# Patient Record
Sex: Female | Born: 1980 | Race: Black or African American | Hispanic: No | Marital: Single | Smoking: Never smoker
Health system: Southern US, Community
[De-identification: ages and names within clinical notes are randomized; demographics above are authoritative.]

## PROBLEM LIST (undated history)

## (undated) DIAGNOSIS — N62 Hypertrophy of breast: Secondary | ICD-10-CM

## (undated) DIAGNOSIS — F419 Anxiety disorder, unspecified: Secondary | ICD-10-CM

---
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
## 2003-08-17 ENCOUNTER — Emergency Department (HOSPITAL_COMMUNITY): Admission: EM | Admit: 2003-08-17 | Discharge: 2003-08-17 | Payer: Self-pay | Admitting: Emergency Medicine

## 2005-02-01 ENCOUNTER — Emergency Department (HOSPITAL_COMMUNITY): Admission: EM | Admit: 2005-02-01 | Discharge: 2005-02-01 | Payer: Self-pay | Admitting: *Deleted

## 2005-12-25 ENCOUNTER — Emergency Department (HOSPITAL_COMMUNITY): Admission: EM | Admit: 2005-12-25 | Discharge: 2005-12-25 | Payer: Self-pay | Admitting: Emergency Medicine

## 2006-01-27 ENCOUNTER — Emergency Department (HOSPITAL_COMMUNITY): Admission: EM | Admit: 2006-01-27 | Discharge: 2006-01-27 | Payer: Self-pay | Admitting: Emergency Medicine

## 2006-10-10 ENCOUNTER — Ambulatory Visit (HOSPITAL_COMMUNITY): Admission: RE | Admit: 2006-10-10 | Discharge: 2006-10-10 | Payer: Self-pay | Admitting: Obstetrics & Gynecology

## 2006-10-31 ENCOUNTER — Ambulatory Visit (HOSPITAL_COMMUNITY): Admission: RE | Admit: 2006-10-31 | Discharge: 2006-10-31 | Payer: Self-pay | Admitting: Gynecology

## 2007-04-03 ENCOUNTER — Inpatient Hospital Stay (HOSPITAL_COMMUNITY): Admission: AD | Admit: 2007-04-03 | Discharge: 2007-04-03 | Payer: Self-pay | Admitting: Obstetrics & Gynecology

## 2007-04-03 ENCOUNTER — Ambulatory Visit: Payer: Self-pay | Admitting: Obstetrics & Gynecology

## 2007-04-06 ENCOUNTER — Ambulatory Visit (HOSPITAL_COMMUNITY): Admission: RE | Admit: 2007-04-06 | Discharge: 2007-04-06 | Payer: Self-pay | Admitting: Family Medicine

## 2007-04-09 ENCOUNTER — Ambulatory Visit: Payer: Self-pay | Admitting: Gynecology

## 2007-04-09 ENCOUNTER — Inpatient Hospital Stay (HOSPITAL_COMMUNITY): Admission: RE | Admit: 2007-04-09 | Discharge: 2007-04-11 | Payer: Self-pay | Admitting: Obstetrics & Gynecology

## 2008-03-19 ENCOUNTER — Emergency Department (HOSPITAL_COMMUNITY): Admission: EM | Admit: 2008-03-19 | Discharge: 2008-03-19 | Payer: Self-pay | Admitting: Emergency Medicine

## 2008-03-22 ENCOUNTER — Emergency Department (HOSPITAL_COMMUNITY): Admission: EM | Admit: 2008-03-22 | Discharge: 2008-03-22 | Payer: Self-pay | Admitting: Family Medicine

## 2009-10-01 ENCOUNTER — Ambulatory Visit (HOSPITAL_COMMUNITY): Admission: AD | Admit: 2009-10-01 | Discharge: 2009-10-01 | Payer: Self-pay | Admitting: Obstetrics and Gynecology

## 2009-10-01 ENCOUNTER — Encounter (INDEPENDENT_AMBULATORY_CARE_PROVIDER_SITE_OTHER): Payer: Self-pay | Admitting: Obstetrics & Gynecology

## 2010-06-02 ENCOUNTER — Emergency Department (HOSPITAL_COMMUNITY): Admission: EM | Admit: 2010-06-02 | Discharge: 2010-06-02 | Payer: Self-pay | Admitting: Family Medicine

## 2010-08-30 ENCOUNTER — Inpatient Hospital Stay (HOSPITAL_COMMUNITY): Admission: AD | Admit: 2010-08-30 | Discharge: 2010-08-30 | Payer: Self-pay | Admitting: Obstetrics & Gynecology

## 2010-08-30 ENCOUNTER — Ambulatory Visit: Payer: Self-pay | Admitting: Advanced Practice Midwife

## 2011-01-10 LAB — RPR: RPR Ser Ql: NONREACTIVE

## 2011-01-10 LAB — CBC
HCT: 33 % — ABNORMAL LOW (ref 36.0–46.0)
MCH: 26.4 pg (ref 26.0–34.0)
MCHC: 33.3 g/dL (ref 30.0–36.0)
Platelets: 271 10*3/uL (ref 150–400)

## 2011-01-10 LAB — SURGICAL PCR SCREEN
MRSA, PCR: NEGATIVE
Staphylococcus aureus: NEGATIVE

## 2011-01-17 ENCOUNTER — Other Ambulatory Visit: Payer: Self-pay | Admitting: Obstetrics

## 2011-01-17 ENCOUNTER — Inpatient Hospital Stay (HOSPITAL_COMMUNITY)
Admission: RE | Admit: 2011-01-17 | Discharge: 2011-01-19 | DRG: 766 | Disposition: A | Discharge status: Home / Self Care | Payer: Medicaid Other | Source: Ambulatory Visit | Attending: Obstetrics | Admitting: Obstetrics

## 2011-01-17 DIAGNOSIS — O99892 Other specified diseases and conditions complicating childbirth: Secondary | ICD-10-CM | POA: Diagnosis present

## 2011-01-17 DIAGNOSIS — Z22330 Carrier of Group B streptococcus: Secondary | ICD-10-CM

## 2011-01-17 DIAGNOSIS — Z302 Encounter for sterilization: Secondary | ICD-10-CM

## 2011-01-17 DIAGNOSIS — O34219 Maternal care for unspecified type scar from previous cesarean delivery: Principal | ICD-10-CM | POA: Diagnosis present

## 2011-01-17 LAB — TYPE AND SCREEN
ABO/RH(D): AB POS
Antibody Screen: NEGATIVE

## 2011-01-18 LAB — CBC
Hemoglobin: 9 g/dL — ABNORMAL LOW (ref 12.0–15.0)
MCHC: 33 g/dL (ref 30.0–36.0)
MCV: 80.1 fL (ref 78.0–100.0)
RBC: 3.41 MIL/uL — ABNORMAL LOW (ref 3.87–5.11)
RDW: 13.6 % (ref 11.5–15.5)
WBC: 7.4 10*3/uL (ref 4.0–10.5)

## 2011-01-20 NOTE — Discharge Summary (Signed)
  NAMESHAMYIA, [name]                 ACCOUNT NO.:  [id number]  MEDICAL RECORD NO.:  [id number]           PATIENT TYPE:  I  LOCATION:  9106                          [facility]ACILITY:  [facility]  PHYSICIAN:  Roseanna Rainbow, M.D.DATE OF BIRTH:  [birth year]  DATE OF ADMISSION:  01/17/2011 DATE OF DISCHARGE:  01/19/2011                              DISCHARGE SUMMARY   CHIEF COMPLAINT:  The patient is a 30 year old gravida 6, para 2-0-3-3 who presents for an elective repeat cesarean delivery and bilateral tubal ligation.  HISTORY OF PRESENT ILLNESS:  Please see the above.  ALLERGIES:  No known drug allergies.  SOCIAL HISTORY:  No tobacco, ethanol, or drug use.  PRENATAL LABS:  Blood type AB positive, antibody screen negative, RPR nonreactive, rubella immune, hepatitis B surface antigen negative, HIV nonreactive.  PAST OB HISTORY:  Primary cesarean delivery for planned and two elective cesarean deliveries.  There is also history of 3 voluntary terminations of pregnancy.  PAST MEDICAL HISTORY:  She denies.  PAST SURGICAL HISTORY:  Please see the above.  PAST GYN HISTORY:  Please see the above.  FAMILY HISTORY:  Noncontributory.  REVIEW OF SYSTEMS:  Noncontributory.  PHYSICAL EXAMINATION:  VITAL SIGNS:  Stable, afebrile. GENERAL:  A well-developed, well-nourished black female in no apparent distress. ABDOMEN:  Gravid. PELVIC:  Deferred.  ASSESSMENT:  Multipara at term with history of 2 previous cesarean deliveries, now for an elective repeat cesarean delivery.  HOSPITAL COURSE:  The patient was admitted and underwent a repeat cesarean delivery and bilateral tubal ligation.  Please see the dictated operative summary for findings.  On postoperative day #1, hemoglobin was 9 and a preoperative hemoglobin was 11.  Remainder of her hospital course was uneventful.  She was discharged to home on postoperative day #2.  DISCHARGE DIAGNOSIS:  Multipara at term with a history of 2  previous cesarean deliveries, desires a sterilization procedure.  PROCEDURES:  Repeat cesarean delivery and bilateral tubal ligation.  CONDITION:  Good.  DIET:  Regular.  ACTIVITY:  Pelvic rest, progressive activity.  MEDICATIONS:  Percocet 5/325 one to two tabs every 6 hours as needed. She was counseled to continue prenatal vitamins and over-the-counter iron supplement.  DISPOSITION:  The patient was to follow up with Dr. Gaynell Face.     Roseanna Rainbow, M.D.     [name]  D:  01/19/2011  T:  01/20/2011  Job:  [id number]  cc:   Kathreen Cosier, M.D. Fax: [phone]  Electronically Signed by Antionette Char M.D. on 01/20/2011 10:25:16 AM

## 2011-01-23 NOTE — Op Note (Addendum)
  NAMEBERNADEAN, [name]                 ACCOUNT NO.:  [id number]  MEDICAL RECORD NO.:  [id number]           PATIENT TYPE:  I  LOCATION:  9106                          [facility]:  [facility]  PHYSICIAN:  Kathreen Cosier, M.D.DATE OF BIRTH:  03-14-1981  DATE OF PROCEDURE:  01/17/2011 DATE OF DISCHARGE:                              OPERATIVE REPORT   PREOPERATIVE DIAGNOSES:  Previous cesarean section x2 at term, multiparity, desires repeat low-transverse cesarean section and tubal ligation.  SURGEON:  Kathreen Cosier, MD  FIRST ASSISTANT:  Charles A. Clearance Coots, MD  PROCEDURE:  The patient placed on the operating table under supine position after the spinal was administered.  Abdomen prepped and draped. Bladder emptied with a Foley catheter.  Transverse suprapubic incision was made and carried down to the old scar.  Fascia cleaned and the fascia incised.  Peritoneum opened longitudinally.  Transverse incision was made in the visceral peritoneum above the bladder.  Bladder mobilized inferiorly.  Transverse lower uterine incision was made. There was a window present and the uterine wall was opened manually. She was delivered of a female 6 pounds 6 ounces from the LOA position. The fluid was clear.  Placenta was posterior and removed manually and sent to Labor and Delivery.  Uterine cavity cleaned with dry laps. Uterine incision was closed in one layer with continuous suture of #1 chromic.  Hemostasis was satisfactory.  The right tube grasped in the midportion with a Babcock clamp.  Zero plain suture placed in the mesosalpinx below the portion of tube within the clamp.  This was tied and approximately 1-inch of tube transected.  Hemostasis was satisfactory.  Procedure was done in a similar fashion on the other side.  Lap and sponge counts correct.  Abdomen closed in layers. Peritoneum continuous suture of 0 chromic fascia, continuous suture with 0 Dexon and the skin closed with subcuticular  stitch of 4-0 Monocryl. Blood loss 500 mL.  The patient tolerated the procedure well, was taken to recovery room in good condition.          ______________________________ Kathreen Cosier, M.D.     BAM/MEDQ  D:  01/17/2011  T:  01/18/2011  Job:  [id number]  Electronically Signed by Francoise Ceo M.D. on 01/23/2011 08:16:30 AM

## 2011-02-28 LAB — URINALYSIS, ROUTINE W REFLEX MICROSCOPIC
Glucose, UA: NEGATIVE mg/dL
Urobilinogen, UA: 0.2 mg/dL (ref 0.0–1.0)

## 2011-02-28 LAB — WET PREP, GENITAL: Trich, Wet Prep: NONE SEEN

## 2011-03-03 LAB — POCT URINALYSIS DIP (DEVICE)
Glucose, UA: NEGATIVE mg/dL
Hgb urine dipstick: NEGATIVE
Ketones, ur: 15 mg/dL — AB
Nitrite: NEGATIVE
Protein, ur: NEGATIVE mg/dL
Specific Gravity, Urine: 1.01 (ref 1.005–1.030)

## 2011-03-21 LAB — DIFFERENTIAL
Basophils Absolute: 0 10*3/uL (ref 0.0–0.1)
Basophils Relative: 0 % (ref 0–1)
Eosinophils Absolute: 0 10*3/uL (ref 0.0–0.7)
Eosinophils Relative: 0 % (ref 0–5)
Lymphocytes Relative: 10 % — ABNORMAL LOW (ref 12–46)
Lymphs Abs: 0.5 10*3/uL — ABNORMAL LOW (ref 0.7–4.0)
Monocytes Absolute: 0.3 10*3/uL (ref 0.1–1.0)
Monocytes Relative: 7 % (ref 3–12)
Neutro Abs: 3.8 10*3/uL (ref 1.7–7.7)
Neutrophils Relative %: 82 % — ABNORMAL HIGH (ref 43–77)

## 2011-03-21 LAB — URINALYSIS, ROUTINE W REFLEX MICROSCOPIC
Bilirubin Urine: NEGATIVE
Glucose, UA: NEGATIVE mg/dL
Ketones, ur: NEGATIVE mg/dL
Leukocytes, UA: NEGATIVE
Nitrite: NEGATIVE
Protein, ur: NEGATIVE mg/dL
Specific Gravity, Urine: <1.005 — ABNORMAL LOW (ref 1.005–1.030)
Urobilinogen, UA: 0.2 mg/dL (ref 0.0–1.0)
pH: 5.5 (ref 5.0–8.0)

## 2011-03-21 LAB — CBC
HCT: 28.5 % — ABNORMAL LOW (ref 36.0–46.0)
Hemoglobin: 9.8 g/dL — ABNORMAL LOW (ref 12.0–15.0)
MCHC: 34.4 g/dL (ref 30.0–36.0)
MCV: 86.7 fL (ref 78.0–100.0)
Platelets: 260 10*3/uL (ref 150–400)
RBC: 3.29 MIL/uL — ABNORMAL LOW (ref 3.87–5.11)
RDW: 12.6 % (ref 11.5–15.5)
WBC: 4.6 10*3/uL (ref 4.0–10.5)

## 2011-03-21 LAB — URINE MICROSCOPIC-ADD ON: RBC / HPF: NONE SEEN RBC/hpf (ref <3)

## 2011-03-21 LAB — WET PREP, GENITAL
Trich, Wet Prep: NONE SEEN
Yeast Wet Prep HPF POC: NONE SEEN

## 2011-03-21 LAB — TYPE AND SCREEN
ABO/RH(D): AB POS
Antibody Screen: NEGATIVE

## 2011-05-03 NOTE — Op Note (Signed)
[name], [name]                 ACCOUNT NO.:  [id number]   MEDICAL RECORD NO.:  [id number]          PATIENT TYPE:  INP   LOCATION:  9124                          [facility]  [facility]   PHYSICIAN:  Ginger Carne, MD  DATE OF BIRTH:  27-Nov-1981   DATE OF PROCEDURE:  04/09/2007  DATE OF DISCHARGE:                               OPERATIVE REPORT   PREOPERATIVE DIAGNOSIS:  Intrauterine pregnancy at 41 weeks 5 days  gestation, history of previous cesarean section, failure to dilate,  failed induction.   POSTOPERATIVE DIAGNOSIS:  Intrauterine pregnancy at 41 weeks 5 days  gestation, history of previous cesarean section, failure to dilate,  failed induction.   PROCEDURE:  Repeat low transverse cesarean section.   SURGEON:  Ginger Carne, M.D.   ASSISTANT:  Paticia Stack, M.D.   ANESTHESIA:  Spinal.   SPECIMEN:  Placenta sent to labor and delivery.   ESTIMATED BLOOD LOSS:  600 mL.   COMPLICATIONS:  None.   FINDINGS:  Viable female infant, Apgars 8 and 9 at 1 and 5 minutes  respectively.  Birth weight 7 pounds 12 ounces.   INDICATIONS FOR PROCEDURE:  This is a 30 year old gravida 4, para 1-0-2-  2, at 41 weeks 5 days gestation, who presented for induction of labor  secondary to post dates.  Patient with a history of previous cesarean  section. Cervix was found to be closed, started induction with Pitocin.  The cervix remained closed after several hours. The patient started  feeling uterine contractions and opted for a repeat cesarean section.   DESCRIPTION OF PROCEDURE:  The patient was taken to the operating room  where her spinal anesthesia was found to be adequate.  She was then  prepped and draped in the normal sterile fashion in the dorsal supine  position with a leftward tilt.  A Pfannenstiel skin incision was then  made with a scalpel and carried through to the underlying layer of  fascia.  The fascia was incised in the midline and the incision extended  laterally  with Mayo scissors.  The superior aspect of the fascial  incision was then grasped with Kocher clamps, elevated, and the  underlying rectus muscles dissected off bluntly.  Attention was then  turned to the inferior aspect of the incision which, in a similar  fashion, was grasped, tented up with Kocher clamps, and the rectus  muscles dissected off bluntly. The rectus muscles were then separated in  the midline and the peritoneum identified, tented up, and entered  sharply with the Metzenbaum scissors.  The bilateral rectus muscles were  partially transected to allow more room for delivery of the infant.   The peritoneal incision was then extended superiorly and inferiorly with  good visualization of the bladder. The bladder blade was inserted and  the vesicouterine peritoneum identified, grasped with pickups, and  entered sharply with the Metzenbaum scissors.  This incision was then  extended laterally and a bladder flap created digitally.  The bladder  blade was then reinserted and the lower uterine segment incised in a  transverse fashion with a  scalpel.  The uterine incision was then  extended bluntly.  The bladder blade was removed and the infant's head  delivered atraumatically.  The infant was found to be in the direct OP  presentation.  The nose and mouth were suctioned and the cord clamped  and cut.  The infant was handed off to the awaiting pediatrician. Cord  blood was obtained and sent to the laboratory. The placenta was then  removed manually, the uterus exteriorized and cleared of all clots and  debris.  The uterine incision was repaired with 0 Vicryl in a running  locked fashion.  A second suture was used to obtain excellent  hemostasis. The uterus was returned to the abdomen and the uterine  incision reinspected several times with excellent hemostasis noted.   The gutters were cleared of all clots and debris and the fascia was  reapproximated with 0 Vicryl in a running  fashion.  The skin was closed  with staples.  The patient tolerated the procedure well.  Sponge, lap  and needle counts were correct x2. The patient was taken to the recovery  room in stable condition.     ______________________________  Paticia Stack, MD      Ginger Carne, MD  Electronically Signed    LNJ/MEDQ  D:  04/09/2007  T:  04/09/2007  Job:  [id number]

## 2011-05-03 NOTE — Op Note (Signed)
[facility]  Patient:    [name], [name] Visit Number: [id number] MRN: [id number]          Service Type: OBS Location: 910A [id number] [id number] Attending Physician:  Antionette Char Dictated by:   Bing Neighbors Clearance Coots, M.D. Proc. Date: 08/22/01 Admit Date:  08/21/2001                             Operative Report  PREOPERATIVE DIAGNOSES:       1. Thirty-eight weeks gestation.                               2. Twins, vertex/breech.                               3. Refuses vaginal birth.  POSTOPERATIVE DIAGNOSES:      1. Thirty-eight weeks gestation.                               2. Twins, vertex/breech.                               3. Refuses vaginal birth.  PROCEDURE:                    Primary low transverse cesarean section.  SURGEON:                      Charles A. Clearance Coots, M.D.  ASSISTANT:                    Lysle Pearl, OR tech.  ANESTHESIA:                   Spinal.  ESTIMATED BLOOD LOSS:         800 ml.  INTRAVENOUS FLUIDS:           1300 ml.  URINE OUTPUT:                 600 ml, clear.  COMPLICATIONS:                None.  DRAINS:                       Foley to gravity.  FINDINGS:                     Baby A, viable female, at 70 with Apgars of 6 at one minute and 8 at five minutes and weight of 7 pounds 4 ounces. Baby B, viable female, at 20 with Apgars of 6 at one minute and 8 at five minutes and weight of 6 pounds 4 ounces. Normal uterus, ovaries, and fallopian tubes.  DESCRIPTION OF PROCEDURE:     The patient was brought to the operating room. After satisfactory spinal anesthesia, the abdomen was prepped and draped in the usual sterile fashion. A Pfannenstiel skin incision was made with the scalpel that was deepened down to the fascia with the scalpel. The fascia was nicked in the midline and the fascial incision was extended to the left and to the right with curved Mayo scissors. The superior and inferior fascial edges were taken off  off  of the rectus muscles with both blunt and sharp dissection. The rectus muscles were bluntly and sharply divided in the midline. The peritoneum was entered digitally and was digitally extended to the left and to the right. The bladder blade was positioned and the vesicouterine fold of peritoneum above the reflection of the urinary bladder was grasped with forceps and was incised and undermined with Metzenbaum scissors. The incision was extended to the left and to the right with the Metzenbaum scissors. The bladder flap was bluntly developed and the bladder blade was repositioned in front of the urinary bladder placing it well out of the operative field. The uterus was entered transversely in the lower uterine segment with the scalpel. Clear amniotic fluid was expelled. The uterine incision was then extended to the left and to the right digitally. The vertex was hyperextended and the occiput could not be brought into the incision for delivery and vacuum extraction was therefore applied. A Mityvac cup was applied to the occiput and delivery was accomplished in routine fashion without complications. The delivery was then completed with the aid of fundal pressure from the assistant. The infants mouth and nose were suctioned with the suction bulb and the umbilical cord was doubly clamped and cut and the infant was handed off the nursery staff. The amniotic sac was ruptured on Baby B and was noted to be in the breech presentation. Footling breech delivery was then accomplished without complications. The infants mouth and nose were suctioned with the suction bulb, umbilical cord was doubly clamped and cut, and the infant was handed off to the nursery staff. Cord blood was obtained from the umbilical cords from Baby A and Baby B. The placenta was spontaneously expelled from the uterine cavity intact. The uterus was exteriorized and the endometrial surface was thoroughly debrided with a dry lap  sponge. The edges of the uterine incision were grasped with a ring forceps and the uterus was closed with a continuous interlocking suture of 0 Monocryl from each corner to the center. Hemostasis was excellent. The uterus was placed back in its normal anatomic position. The pelvic cavity was thoroughly irrigated with warm saline solution and all clots were removed. The abdomen was then closed as follows. The fascia was closed with continuous suture of PDS from each corner to the center. The subcutaneous tissue was thoroughly irrigated with warm saline solution and all areas of subcutaneous bleeding were coagulated with the Bovie. The skin was then approximated with surgical stainless steel staples. A sterile bandage was applied to the incision closure. The surgical technician indicated that all needle, sponge, and instrument counts were correct. The patient tolerated the procedure well and was transported to the recovery room in satisfactory condition.  Dictated by:   Bing Neighbors Clearance Coots, M.D. Attending Physician:  Antionette Char DD:  08/22/01 TD:  08/23/01 Job: [id number]

## 2011-05-03 NOTE — Discharge Summary (Signed)
[facility]  Patient:    [name], [name] Visit Number: [id number] MRN: [id number]          Service Type: Attending:  Bing Neighbors. Clearance Coots, M.D. Dictated by:   Mont Dutton, M.D. Adm. Date:  08/21/01 Disc. Date: 08/25/01                             Discharge Summary  ADMISSION DIAGNOSIS:          The patient is a 30 year old gravida 1, para 0, African-American female admitted on August 21, 2001, at 38 weeks and 4 days with dichorionic-diamniotic twins, discordant growth A greater than B, for induction of labor.  Also had unfavorable cervix and episodic uterine contractions.  DISCHARGE DIAGNOSIS:          Status post cesarean section for twin delivery after attempt at induction of labor with Cervidil ripening.  HOSPITAL COURSE:              The patient was admitted initially for induction of labor.  She was at 38 weeks 4 days.  Twin A was vertex, twin B was breech. Also had discordant growth, A greater than B.  The patient was given Cervidil x 1.  Attempted labor for approximately 14 hours.  After 14 hours the patient said that she did not want to go through induction of labor and opted for C-section as the patient adamantly refused labor and delivery and second twin breech.  The risks, benefits, and complications of the procedure were explained.  The patient underwent C-section on August 22, 2001.  The procedure was a low transverse cesarean section.  Surgeon was Dr. Clearance Coots. Spinal anesthesia.  Estimated blood loss 800 mL, 1300 mL IV fluids.  No complications.  The patient had Foley to gravity.  Baby A was a viable female at 1735, Apgars of 6 at one and 8 at five, weight 7 pounds and 4 ounces. Baby B was a viable female at 1736, Apgars of 6 at one and 8 at five, weight was 6 pounds and 4 ounces.  Normal uterus, ovaries, and fallopian tubes.  The patient tolerated the procedure well.  For remainder of procedure, see dictated procedure note,  [phone].  On postoperative day #2 the patient was noted to have anemia, hemoglobin 8.0 on admission, was increased to 8.4 on postoperative day #2.  Started on iron sulfate 325 mg b.i.d.  On hospital day #3 the patient was eating well, voiding well, had positive flatus, and bowel movement.  Staples were removed, Steri-Strips were placed without complication.  The patient was overall stable.  She was ready for discharge to home.  Her abdomen was soft, nontender.  Incision was clean, dry, and intact. Only had scant lochia.  The patient was discharged on August 25, 2001.  She was given Depo-Provera injection prior to her discharge.  DISCHARGE MEDICATIONS:        1. Percocet 1 tablet p.o. q.6h. p.r.n.,                                  prescription given for #30, no refills.                               2. Iron sulfate 325 mg 1 tablet p.o. b.i.d.  3. Prenatal vitamins q.d.                               4. Motrin 600 mg q.6h. p.r.n.                               5. Colace p.r.n.                               6. Dulcolax suppository p.r.n.                               7. ______ p.r.n.  DIET:                         Regular.  ACTIVITY:                     Per booklet.  SEXUAL ACTIVITY:              Per booklet.  FOLLOW-UP:                    The patient has an appointment for the high-risk clinic on October 06, 2001, at 2:20 p.m.  DISPOSITION:                  Discharged to home.  CONDITION ON DISCHARGE:       Stable. Dictated by:   Mont Dutton, M.D. Attending:  Bing Neighbors. Clearance Coots, M.D. DD:  05/21/02 TD:  05/24/02 Job: [id number]

## 2011-05-03 NOTE — Discharge Summary (Signed)
[name], [name]                 ACCOUNT NO.:  [id number]   MEDICAL RECORD NO.:  [id number]          PATIENT TYPE:  INP   LOCATION:  9124                          [facility]ACILITY:  [facility]   PHYSICIAN:  Allie Bossier, MD        DATE OF BIRTH:  June 19, 1981   DATE OF ADMISSION:  04/09/2007  DATE OF DISCHARGE:                               DISCHARGE SUMMARY   DISCHARGE DIAGNOSES:  1. Intrauterine pregnancy at 41-5/7 weeks' gestational age, status      post repeat low transverse cesarean section.  2. Group B Streptococcus positive.   PROCEDURES:  Repeat elective low transverse cesarean section.   LABORATORY DATA:  Pre-delivery hemoglobin 11.1, hematocrit 33.5, and  platelets 373.  Post-delivery hemoglobin 9.1, hematocrit 27.2, and  platelets 331.  RPR nonreactive.  Blood type AB positive, antibody  screen negative.  Rubella immune.  RPR negative.  HIV nonreactive.  GBS  positive.  Hepatitis B negative.   HOSPITAL COURSE:  A 30 year old G44, P1-0-2-2, with prior twin gestation  with a C-section, presented at 41-5/7 weeks' gestational age for  induction of labor and opted for elective repeat C-section.  Please see  the OR note for uncomplicated delivery.  A vigorous female infant was  delivered with Apgars of 8 at one minute and 9 at five minutes with an  estimated blood loss of approximately 600 mL.  Mother and baby were  without complications.  Mom is bottle-feeding exclusively and received  Depo prior to discharge.  The patient asked for early discharge and  given the uncomplicated course, this is appropriate.  She was GBS-  positive and penicillin was given prior to opting for her C-section.  She was advised of pelvic rest x6 weeks and will need to follow up with  the health department in 6 weeks for a postpartum check.  Baby Love  nurse is to remove her staples on postop day 3-5.   DISCHARGE MEDICATIONS:  1. Prenatal vitamin one daily.  2. Ibuprofen 600 mg p.o. q.4-6h. p.r.n. pain.  3.  Percocet one to two tablets p.o. q.4-6h. p.r.n. pain, #30 given.     ______________________________  Lupita Raider, M.D.      Allie Bossier, MD  Electronically Signed    KS/MEDQ  D:  04/11/2007  T:  04/11/2007  Job:  [id number]

## 2011-05-03 NOTE — Consult Note (Signed)
NAMELEXINE, JASPERS                 ACCOUNT NO.:  [id number]   MEDICAL RECORD NO.:  [id number]          PATIENT TYPE:  MAT   LOCATION:  MATC                          FACILITY:  [facility]   PHYSICIAN:  Lazaro Arms, M.D.   DATE OF BIRTH:  [birth year]   DATE OF CONSULTATION:  04/03/2007  DATE OF DISCHARGE:                                 CONSULTATION   MATERNAL ADMISSIONS UNIT OBSERVATION NOTE.   [name] is a 30 year old African-American female gravida 4, para 1,  elective abortion 2, who is followed at the health department for  antepartum care.  Estimated date of delivery is March 28, 2007, by a 15  week and 6 day ultrasound that was discrepant from her last menstrual  period.  She is currently at 40-6/[redacted] weeks gestation.  She comes in with  no complaint, just having missed an appointment, and she has missed  several appointments during the pregnancy.  She called the Health  Department, they told her to be seen because of the missed appointment.  She reports good fetal movement, no bleeding, no gush of fluid and no  regular uterine contractions.  On external fetal monitor she has a  reactive NST, no decels, she is having a little uterine inability but  nothing to even rise to the level of contractions.  Her cervix is long,  thick, closed, firm, posterior, vertex and about -2 station.  She is  quite unfavorable at this point.  She did have a previous C-section for  twin gestation back in 2002 with the second twin being breech, and she  has decided to do vaginal birth after cesarean.  At the present time she  is discharged home.  She is instructed to contact the Health Department  on Monday for appointment and decision and coming back in for induction  of labor later in the week.  She understands that that will be again  another decision point in whether to continue with a vaginal birth after  cesarean, at the time if induction is needed.  The rest of her  antepartum course has been  unremarkable and she reports no other  problems.  She is discharged to home.      Lazaro Arms, M.D.  Electronically Signed     LHE/MEDQ  D:  04/03/2007  T:  04/04/2007  Job:  [id number]

## 2014-10-24 ENCOUNTER — Emergency Department (HOSPITAL_COMMUNITY)
Admission: EM | Admit: 2014-10-24 | Discharge: 2014-10-24 | Disposition: A | Discharge status: Home / Self Care | Payer: Medicaid Other | Attending: Emergency Medicine | Admitting: Emergency Medicine

## 2014-10-24 ENCOUNTER — Emergency Department (HOSPITAL_COMMUNITY): Payer: Medicaid Other

## 2014-10-24 ENCOUNTER — Encounter (HOSPITAL_COMMUNITY): Payer: Self-pay | Admitting: Emergency Medicine

## 2014-10-24 DIAGNOSIS — S99921A Unspecified injury of right foot, initial encounter: Secondary | ICD-10-CM | POA: Insufficient documentation

## 2014-10-24 DIAGNOSIS — S91301A Unspecified open wound, right foot, initial encounter: Secondary | ICD-10-CM | POA: Insufficient documentation

## 2014-10-24 DIAGNOSIS — Y9389 Activity, other specified: Secondary | ICD-10-CM | POA: Insufficient documentation

## 2014-10-24 DIAGNOSIS — W208XXA Other cause of strike by thrown, projected or falling object, initial encounter: Secondary | ICD-10-CM | POA: Insufficient documentation

## 2014-10-24 DIAGNOSIS — Y998 Other external cause status: Secondary | ICD-10-CM | POA: Insufficient documentation

## 2014-10-24 DIAGNOSIS — T1490XA Injury, unspecified, initial encounter: Secondary | ICD-10-CM

## 2014-10-24 DIAGNOSIS — Y9289 Other specified places as the place of occurrence of the external cause: Secondary | ICD-10-CM | POA: Insufficient documentation

## 2014-10-24 MED ORDER — NAPROXEN 500 MG PO TABS: 500.0000 mg | ORAL_TABLET | Freq: Two times a day (BID) | ORAL | Status: DC

## 2014-10-24 NOTE — ED Notes (Signed)
refused wheelchair 

## 2014-10-24 NOTE — Discharge Instructions (Signed)
Please follow directions provided. You may use the primary care provider referral given to establish care for regular healthcare needs. You may use the orthopedic referral given, to  follow-up if your pain continues.  You may use Tylenol 650 mg by mouth every 4 hours for ibuprofen 400 mg by mouth every 6 hours for pain and inflammation. You may use ice 20 minutes on 20 minutes off as needed for pain and swelling. Don't hesitate to return for any new, worsening, or concerning symptoms.  SEEK IMMEDIATE MEDICAL CARE IF:  You have increased redness, swelling, or pain in your foot.  Your swelling or pain is not relieved with medicines.  You have loss of feeling in your foot or are unable to move your toes.  Your foot turns cold or blue.  You have pain when you move your toes.  Your foot becomes warm to the touch.  Your contusion does not improve in 2 days.    Emergency Department Resource Guide 1) Find a Doctor and Pay Out of Pocket Although you won't have to find out who is covered by your insurance plan, it is a good idea to ask around and get recommendations. You will then need to call the office and see if the doctor you have chosen will accept you as a new patient and what types of options they offer for patients who are self-pay. Some doctors offer discounts or will set up payment plans for their patients who do not have insurance, but you will need to ask so you aren't surprised when you get to your appointment.  2) Contact Your Local Health Department Not all health departments have doctors that can see patients for sick visits, but many do, so it is worth a call to see if yours does. If you don't know where your local health department is, you can check in your phone book. The CDC also has a tool to help you locate your state's health department, and many state websites also have listings of all of their local health departments.  3) Find a Walk-in Clinic If your illness is not likely to be  very severe or complicated, you may want to try a walk in clinic. These are popping up all over the country in pharmacies, drugstores, and shopping centers. They're usually staffed by nurse practitioners or physician assistants that have been trained to treat common illnesses and complaints. They're usually fairly quick and inexpensive. However, if you have serious medical issues or chronic medical problems, these are probably not your best option.  No Primary Care Doctor: - Call Health Connect at  [phone] - they can help you locate a primary care doctor that  accepts your insurance, provides certain services, etc. - Physician Referral Service- [phone]  Chronic Pain Problems: Organization         Address  Phone   Notes  [facility]  [phone] Patients need to be referred by their primary care doctor.   Medication Assistance: Organization         Address  Phone   Notes  [facility] Medication [facility] [address] [place], [place] [address] [phone] --Must be a resident of [facility] -- Must have NO insurance coverage whatsoever (no Medicaid/ Medicare, etc.) -- The pt. MUST have a primary care doctor that directs their care regularly and follows them in the community   [facility]  [phone]   [address]  [phone]  Agencies that provide inexpensive medical care: Organization         Address  Phone   Notes  [facility]  [phone]) [phone]   [facility]    [phone]   [facility] [address] [place], [place] [address] [phone]) [phone]   [facility] [place] [address], [place] [phone]   Planned Parenthood    [phone](336) [phone]   [facility]    [phone]   [facility]  [address], [place] Phone:  [phone], Fax:  [phone]) [phone] Hours of Operation:  9 am - 6 pm, M-F.  Also  accepts Medicaid/Medicare and self-pay.  [facility]  [address], [place] Phone: [phone]) [phone], Fax: [phone]) [phone]. Hours of Operation:  8:30 am - 5:30 pm, M-F.  Also accepts Medicaid and self-pay.  [facility] [place] [address], [place] Phone: [phone]) [phone]   [facility] [address], [place], [place] [phone], Ext. [phone] Mondays & Thursdays: 7-9 AM.  First 15 patients are seen on a first come, first serve basis.    Medicaid-accepting [facility] Providers:  Organization         Address  Phone   Notes  [facility] [address], [place] [phone] Also accepts self-pay patients.  [facility] [address], [place]  [phone]) [phone]   [facility] [address], [place] [phone]) [phone]   [facility] [address] [place] [address], [place] [phone]) [phone]   Renaye RakersVeita Bland [address], [place]   [phone] Only accepts [place] Access [place] patients after they have their name applied to their card.   Self-Pay (no insurance) in [place]:  Organization         Address  Phone   Notes  Sickle Cell Patients, [facility] [address], [place] [phone]) [phone]   [facility] [address], [place] [phone]) [phone]   [facility] [place]  [address], [place] [phone]   [facility]/Dr. Osei-Bonsu  [address] [place] [address], [place] or [phone], [place] [phone] Phone number for both [place] and [place] locations is the same.  [facility] [address] [place] [address], [place] [phone]) [phone]   [facility] [place] [address] [place] [address], [place] or [address] [phone]) [phone]   [facility] [address],  [place] [phone]) [phone], phone; [phone]) [phone], fax Sees patients 1st and 3rd Saturday of every month.  Must not qualify for public or private insurance (i.e. Medicaid, Medicare, [place] Health Choice, Veterans' Benefits)  Household income should be no more than 200% of the poverty level The clinic cannot treat you if you are pregnant or think you are pregnant  Sexually transmitted diseases are not treated at the clinic.    Dental Care: Organization         Address  Phone  Notes  [facility] [address], [place] [phone]) [phone] Accepts children up to age 31 who are enrolled in [place] or [place] Health Choice; pregnant women with a Medicaid card; and children who have applied for Medicaid or  [place] Health Choice, but were declined, whose parents can pay a reduced fee at time of service.  [facility]  [address], [place] [phone] Accepts children up to age 62 who are enrolled in [place] or [place] Health Choice; pregnant women with a Medicaid card; and children who have applied for Medicaid or [place] Health Choice, but were declined, whose parents can pay a reduced fee at time of service.  [facility]  [address] [place], [place] [phone] Patients are seen by appointment only. Walk-ins are not accepted. [facility] will see patients 62 years of age and older. Monday - Tuesday (8am-5pm) Most Wednesdays (8:30-5pm) $30 per visit, cash only  [facility] Adult Dental Access PRO[facility]  [address], [facility] [phone] Patients are seen by appointment only. Walk-ins are not accepted. [facility] will see patients 62 years of age and older. One Wednesday Evening (Monthly: Volunteer Based).  $30 per visit, cash only  [employer]  [phone] for adults; Children under age 57, call Graduate Pediatric Dentistry at [phone].  Children aged 52-14, please call [phone] to request a pediatric application.  Dental services are provided in all areas of dental care including fillings, crowns and bridges, complete and partial dentures, implants, gum treatment, root canals, and extractions. Preventive care is also provided. Treatment is provided to both adults and children. Patients are selected via a lottery and there is often a waiting list.   [facility] [address], [place]  [phone] [email or web address]   [facility] [address] [place], [place] [phone], Ext. [phone] Second and Fourth Thursday of each month, opens at 6:30 AM; Clinic ends at 9 AM.  Patients are seen on a first-come first-served basis, and a limited number are seen during each clinic.   [facility]  [address] [place], [place] [phone]   Eligibility Requirements You must have lived in [place], [place], or [place] counties for at least the last three months.   You cannot be eligible for state or federal sponsored [place], including [employer], [place], or [employer].   You generally cannot be eligible for healthcare insurance through your employer.    How to apply: Eligibility screenings are held every Tuesday and Wednesday afternoon from 1:00 pm until 4:00 pm. You do not need an appointment for the interview!  [facility] [address], [place], [place] [phone]   [facility] Health Department  [phone]   [facility] Health Department  [phone]   [facility] Health Department  [phone]    Behavioral Health Resources in the Community: Intensive Outpatient Programs Organization         Address  Phone  Notes  [facility] [address] [place], [place] [phone]   [facility] Outpatient [address], [place], [place] [phone]   [facility] [address] [place], [place]  [phone]   [facility] Mental [facility] [address],  [place], [place] [phone] or [phone]   Substance Abuse Resources Organization         Address  Phone  Notes  [facility] Drug Services  [phone]   [facility]  [phone]   [facility]  [phone]   Floydene Flock  [phone]   Residential & Outpatient Substance Abuse Program  [phone]  Psychological Services Organization         Address  Phone  Notes  [facility]  [phone]- [phone]   [facility]  [phone]- [phone]   [facility] [phone] [address], [place] [phone] or [phone]    Mobile Crisis Teams Organization         Address  Phone  Notes  [facility], Mobile Crisis Care Unit  [phone]   [facility]  [address] [place], [place] [phone]   Doristine LocksSharon DeEsch [address] [place] [phone]    Self-Help/Support Groups Organization         Address  Phone             Notes  [facility] [place] - variety of support groups  [phone]- [phone] Call for more information  Narcotics Anonymous (NA), [facility] [address], [place]  2 meetings at this location   Statisticianesidential Treatment Programs Organization         Address  Phone  Notes  [facility] [address],    [place]  [phone]   [facility]  [address], [place] [phone], [place], [place] [phone]   [facility] [address], [place] [phone] Admissions: 8am-3pm M-F  [facility] [address]    [place], [place] [phone]   [facility] [address], [place], [place] [phone]   [facility] [address]  [place], [place] [phone]   [facility] - Intensive Outpatient [address], [place], [place] [phone]     [facility] [address]  [place], [place] [phone] or [phone]   [facility] ([facility]) [address] [place], [place] [phone] Accepts Medicaid  [facility] [address]  [place] [phone] Substance Abuse/Addiction Treatment   [facility] Behavioral Health Resources Organization         Address  Phone  Notes  [facility]  [phone]) [phone]   Angie FavaJulie Brannon, PhD [address], [address] [place], [place]   [phone]) [phone] or [phone](336) [phone]   [facility]   [address] [place], [place] [phone]) [phone]   [facility] [address], [place], [place] [phone]) [phone] Insurance/Medicaid/sponsorship through [facility]  [facility] Families [address]                                    [place], [place] [phone]) [phone] Therapy/tele-psych/case  [facility] [address].   [place], [place] [phone]) [phone]    Dr. Lolly MustacheArfeen  [phone]) [phone]   [facility] [place]  [employer] [facility] 1) [address], [place] 2) [address], [place] 3)  [address], [place] [phone]) [phone]) [phone]  [phone](336) [phone]   [facility] Child Abuse Hotline [phone]) [phone] or [phone](336) [phone] (After Hours)

## 2014-10-24 NOTE — ED Provider Notes (Signed)
CSN: [id number]     Arrival date & time 10/24/14  1515 History  This chart was scribed for non-physician practitioner, Harle BattiestElizabeth Ruhama Lehew, NP working with Gwyneth SproutWhitney Plunkett, MD by Gwenyth Oberatherine Macek, ED scribe. This patient was seen in room TR07C/TR07C and the patient's care was started at 4:08 PM  Chief Complaint  Patient presents with  . Foot Pain   The history is provided by the patient. No language interpreter was used.    HPI Comments: [name] is a 33 y.o. female who presents to the Emergency Department complaining of 6/10, constant, right foot pain and a wound to the dorsal aspect of her right foot that occurred 2 days ago when she dropped a TV. Pt states swelling and tingling in her right great toe as associated symptoms. She has tried peroxide with no relief.  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 Her last tetanus shot was last year.    History reviewed. No pertinent past medical history. Past Surgical History  Procedure Laterality Date  . Cesarean section     No family history on file. History  Substance Use Topics  . Smoking status: Never Smoker   . Smokeless tobacco: Not on file  . Alcohol Use: Yes   OB History    No data available     Review of Systems  Musculoskeletal: Positive for arthralgias. Negative for gait problem.  Skin: Positive for wound. Negative for color change.    Allergies  Review of patient's allergies indicates no known allergies.  Home Medications   Prior to Admission medications   Not on File   BP 127/72 mmHg  Pulse 74  Temp(Src) 97.7 F (36.5 C) (Oral)  Resp 18  Ht 5\' 6"  (1.676 m)  SpO2 100%  LMP 10/10/2014 (Approximate) Physical Exam  Constitutional: She is oriented to person, place, and time. She appears well-developed and well-nourished. No distress.  HENT:  Head: Normocephalic and atraumatic.  Mouth/Throat: Oropharynx is clear and moist. No oropharyngeal exudate.  Eyes: Pupils  are equal, round, and reactive to light.  Neck: Neck supple.  Cardiovascular: Normal rate.   Pulmonary/Chest: Effort normal.  Musculoskeletal: Normal range of motion. She exhibits tenderness. She exhibits no edema.  Skin tear to dorsal aspect of metatarsals. Mild soft tissue swelling from medial portion of foot to ankle. Tender to palpation to plantar aspect of foot. 5/5 strength in all toes with flexion and extension. Reports tingling sensation in big toe. Good pulse and motor sensation in all other toes. Skin is warm to touch.  Neurological: She is alert and oriented to person, place, and time. No cranial nerve deficit.  Skin: Skin is warm and dry. No rash noted.  Psychiatric: She has a normal mood and affect. Her behavior is normal.  Nursing note and vitals reviewed.   ED Course  Procedures (including critical care time) DIAGNOSTIC STUDIES: Oxygen Saturation is 100% on RA, normal by my interpretation.    COORDINATION OF CARE: 4:14 PM Discussed treatment plan with pt at bedside and pt agreed to plan.  Labs Review Labs Reviewed - No data to display  Imaging Review Dg Foot Complete Right  10/24/2014   CLINICAL DATA:  He dropped on foot 10/22/2014.  Pain.  EXAM: RIGHT FOOT COMPLETE - 3+ VIEW  COMPARISON:  03/22/2008.  FINDINGS: There is no evidence of fracture or dislocation. There is no evidence of arthropathy or other focal bone abnormality. Soft tissues are unremarkable.  IMPRESSION: No acute abnormality.   Electronically Signed   By: Maisie Fushomas  Register   On: 10/24/2014 17:15     EKG Interpretation None      MDM   Final diagnoses:  Trauma  Foot injury, right, initial encounter   33 yo with injury to right foot after dropping tv from low height on foot. Patient X-Ray negative for obvious fracture or dislocation. Pt deferred any pain meds in the ED. Discharge instructions include ortho referral to follow up if symptoms persist. Patient given brace while in ED, conservative  therapy recommended and discussed. Patient will be dc home & is agreeable with above plan. Return precautions provided.  I personally performed the services described in this documentation, which was scribed in my presence. The recorded information has been reviewed and is accurate.  Filed Vitals:   10/24/14 1530  BP: 127/72  Pulse: 74  Temp: 97.7 F (36.5 C)  TempSrc: Oral  Resp: 18  Height: 5\' 6"  (1.676 m)  SpO2: 100%   Meds given in ED:  Medications - No data to display  New Prescriptions   NAPROXEN (NAPROSYN) 500 MG TABLET    Take 1 tablet (500 mg total) by mouth 2 (two) times daily with a meal.       Harle BattiestElizabeth Austina Constantin, NP 10/24/14 [id number]  Gwyneth SproutWhitney Plunkett, MD 10/24/14 2056

## 2014-10-24 NOTE — ED Notes (Signed)
Pt reports she dropped TV on her R foot Saturday. C/o continued pain to top of foot. Swelling and slight bleeding noted to foot. Pt ambulatory.

## 2015-01-18 IMAGING — CR DG FOOT COMPLETE 3+V*R*
3 series · 3 of 3 positions shown · non-contrast
Comparison: 03/22/2008.

CLINICAL DATA: He dropped on foot 10/22/2014.  Pain.

EXAM:
RIGHT FOOT COMPLETE - 3+ VIEW

[t foot ap right]
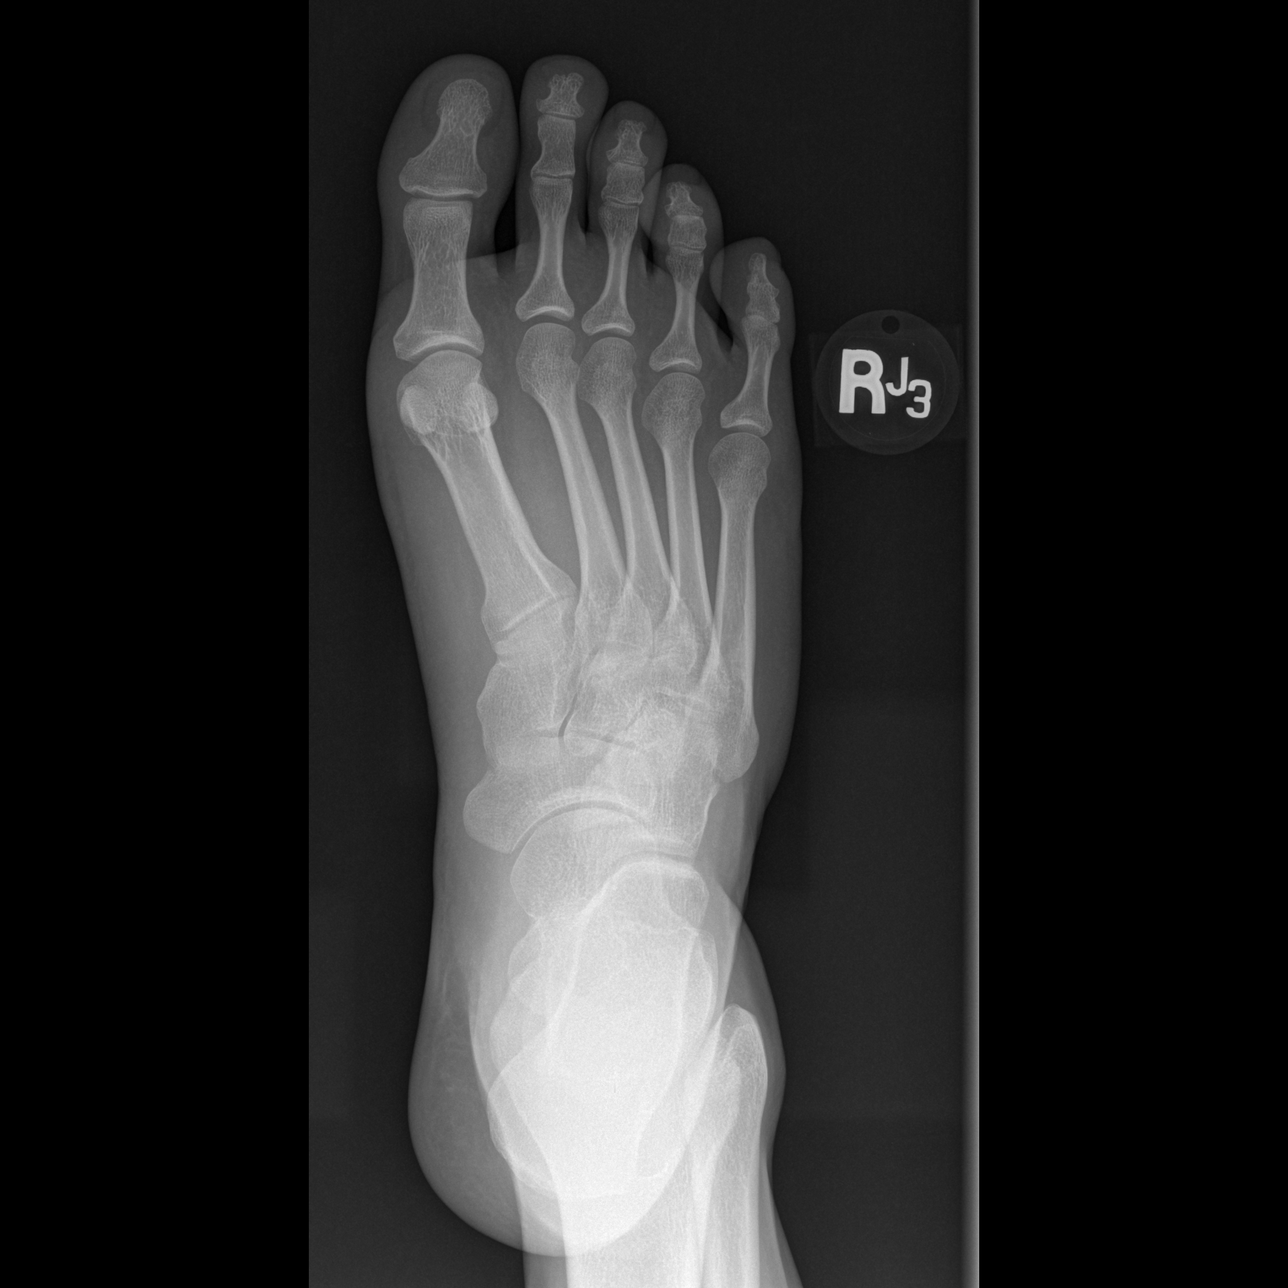

[t foot oblique right]
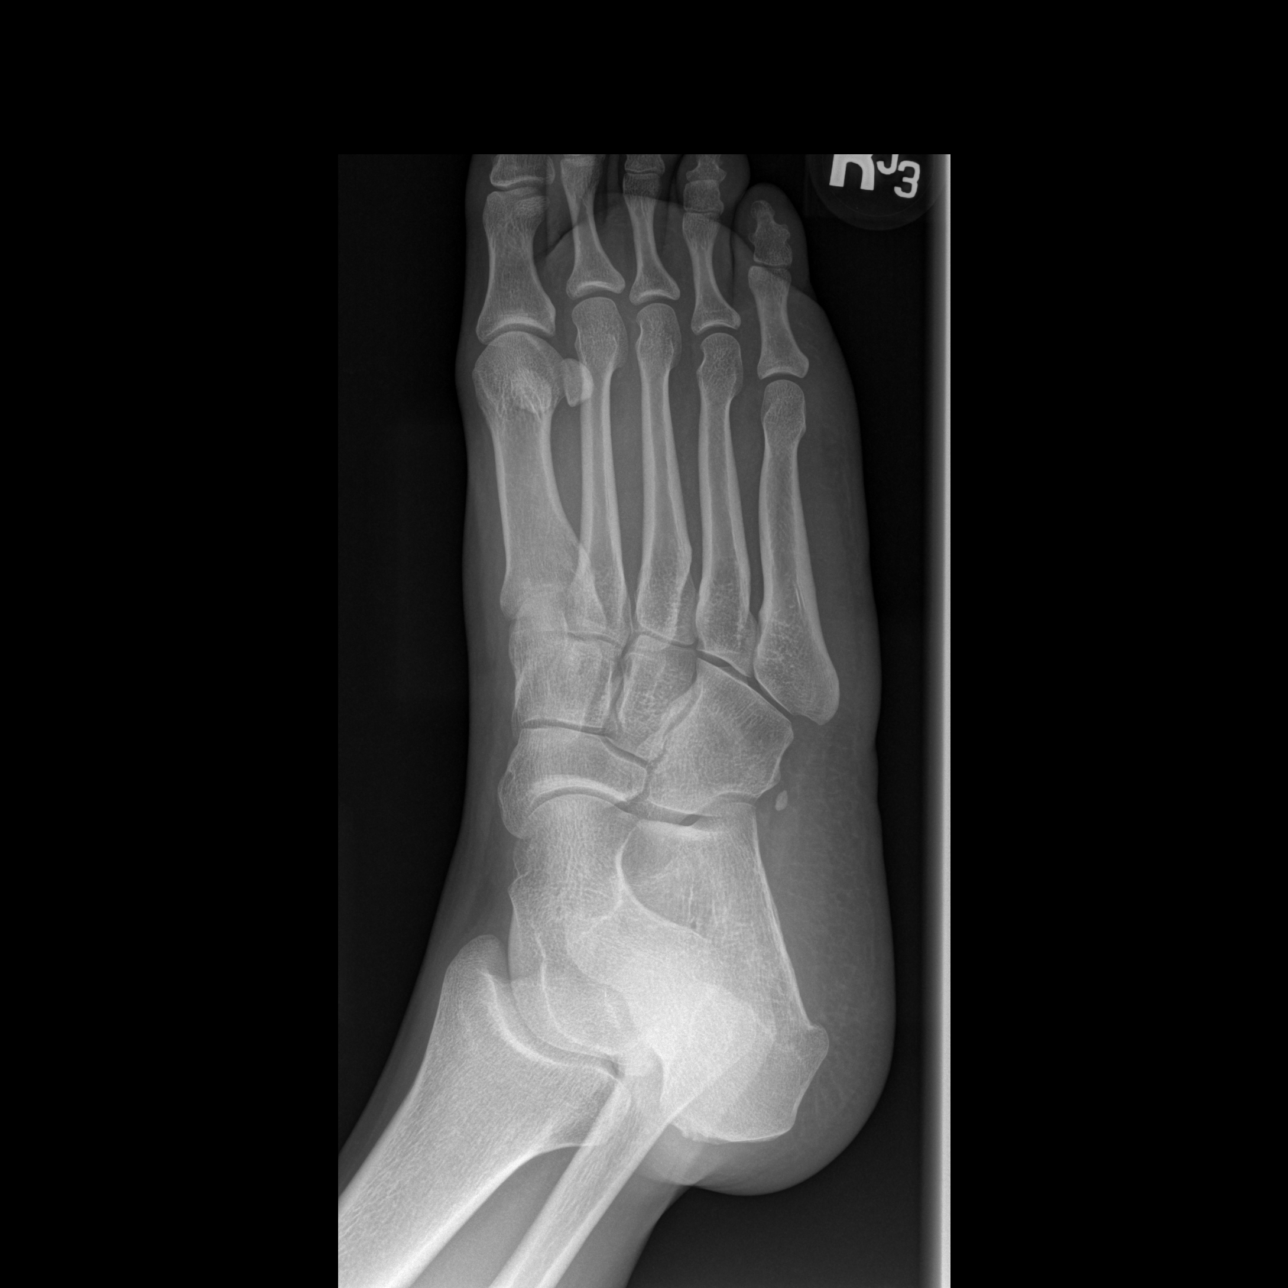

[t foot lat right]
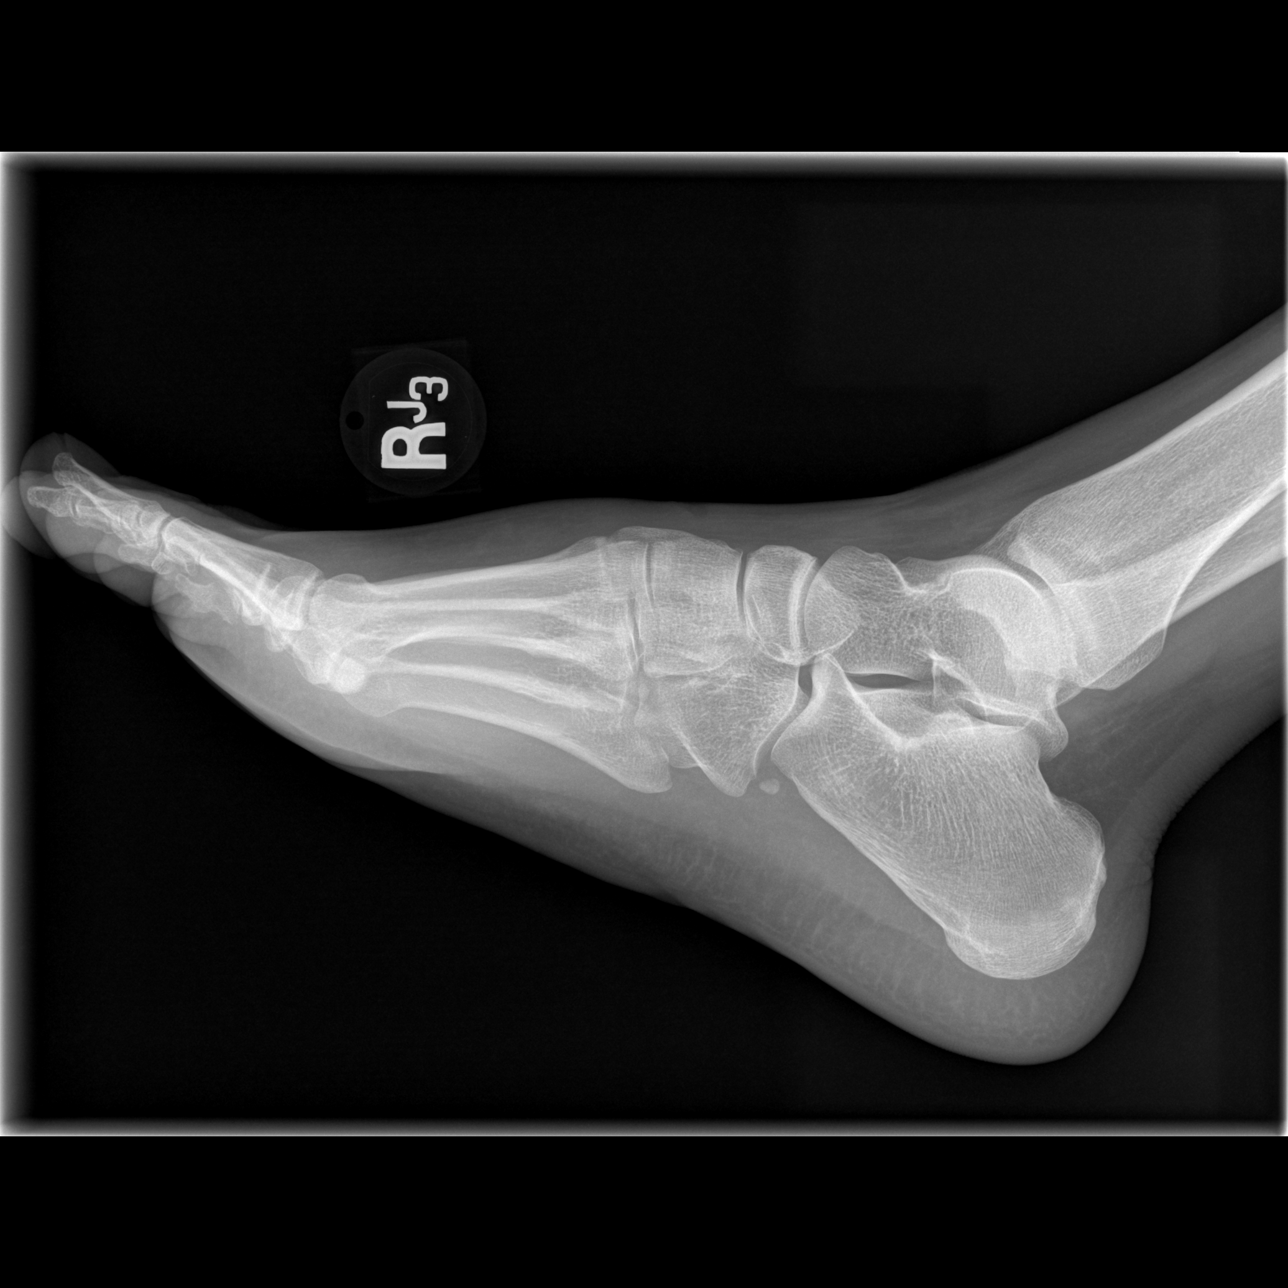

[3 of 3 positions shown; findings below may reference images not displayed]

FINDINGS: There is no evidence of fracture or dislocation. There is no
evidence of arthropathy or other focal bone abnormality. Soft
tissues are unremarkable.
IMPRESSION: No acute abnormality.

## 2021-04-26 ENCOUNTER — Ambulatory Visit (HOSPITAL_COMMUNITY)
Admission: EM | Admit: 2021-04-26 | Discharge: 2021-04-26 | Disposition: A | Discharge status: Home / Self Care | Payer: Self-pay

## 2021-04-26 ENCOUNTER — Other Ambulatory Visit: Payer: Self-pay

## 2022-09-17 ENCOUNTER — Other Ambulatory Visit: Payer: Self-pay

## 2022-09-17 ENCOUNTER — Emergency Department (HOSPITAL_COMMUNITY)
Admission: EM | Admit: 2022-09-17 | Discharge: 2022-09-17 | Disposition: A | Discharge status: Home / Self Care | Payer: Commercial Managed Care - HMO | Attending: Emergency Medicine | Admitting: Emergency Medicine

## 2022-09-17 ENCOUNTER — Encounter (HOSPITAL_COMMUNITY): Payer: Self-pay

## 2022-09-17 DIAGNOSIS — H10423 Simple chronic conjunctivitis, bilateral: Secondary | ICD-10-CM | POA: Insufficient documentation

## 2022-09-17 DIAGNOSIS — H1013 Acute atopic conjunctivitis, bilateral: Secondary | ICD-10-CM

## 2022-09-17 DIAGNOSIS — H11433 Conjunctival hyperemia, bilateral: Secondary | ICD-10-CM | POA: Diagnosis present

## 2022-09-17 NOTE — ED Triage Notes (Signed)
Patient c/o left eye redness and irritation x 2 weeks ago. Patient states she has been using eye drops for allergies and clear eyes with no relief.

## 2022-09-17 NOTE — ED Provider Notes (Signed)
[facility] DEPT Provider Note   CSN: [id number] Arrival date & time: 09/17/22  0932     History  Chief Complaint  Patient presents with   eye redness    [name] is a 41 y.o. female presenting with eye redness for the past couple of months.  Reports that they were tearing but there is no thick discharge coming.  No blurred vision or pain with EOMs.  Does say that she started taking Claritin a week ago but it has not been helping.  Also Visine eyedrops are not helping her.  Of note patient does have a dog that she used to keep is an outdoor dog but 2 months ago she brought this dog inside.  HPI     Home Medications Prior to Admission medications   Medication Sig Start Date End Date Taking? Authorizing Provider  naproxen (NAPROSYN) 500 MG tablet Take 1 tablet (500 mg total) by mouth 2 (two) times daily with a meal. 10/24/14   Britt Bottom, NP      Allergies    Patient has no known allergies.    Review of Systems   Review of Systems  Physical Exam Updated Vital Signs BP (!) 150/91 (BP Location: Left Arm)   Pulse 76   Temp 98.4 F (36.9 C) (Oral)   Resp 16   Ht 5\' 6"  (1.676 m)   Wt 87.1 kg   LMP 09/12/2022   SpO2 100%   BMI 30.99 kg/m  Physical Exam Vitals and nursing note reviewed.  Constitutional:      General: She is not in acute distress.    Appearance: Normal appearance. She is not ill-appearing.  HENT:     Head: Normocephalic and atraumatic.     Nose: Nose normal.     Mouth/Throat:     Mouth: Mucous membranes are moist.     Pharynx: Oropharynx is clear.  Eyes:     General: No scleral icterus.    Extraocular Movements: Extraocular movements intact.     Pupils: Pupils are equal, round, and reactive to light.     Comments: Allergic conjunctivitis  Pulmonary:     Effort: Pulmonary effort is normal. No respiratory distress.  Skin:    General: Skin is warm and dry.     Findings: No rash.  Neurological:      Mental Status: She is alert.  Psychiatric:        Mood and Affect: Mood normal.     ED Results / Procedures / Treatments   Labs (all labs ordered are listed, but only abnormal results are displayed) Labs Reviewed - No data to display  EKG None  Radiology No results found.  Procedures Procedures   Medications Ordered in ED Medications - No data to display  ED Course/ Medical Decision Making/ A&P                           Medical Decision Making  41 year old female presenting with eye redness and watery discharge.  Does not wear eye make-up or contact lenses.  No recent travel.  Does report she has seasonal allergies and started taking Claritin a week ago.  She also states that she has an outdoor dog that she brought inside 2 months ago.  I suspect this to be the etiology of her allergies as her presentation is consistent with allergic conjunctivitis.  She does not have a PCP and will be given a follow-up with  them.  We will change her antihistamine to either Zyrtec or Allegra.  No pain with the or signs of bacterial conjunctivitis.  No further work-up indicated at this time.  No signs of preorbital or orbital cellulitis.  To be discharged.  Final Clinical Impression(s) / ED Diagnoses Final diagnoses:  Allergic conjunctivitis of both eyes    Rx / DC Orders ED Discharge Orders     None      Results and diagnoses were explained to the patient. Return precautions discussed in full. Patient had no additional questions and expressed complete understanding.   This chart was dictated using voice recognition software.  Despite best efforts to proofread,  errors can occur which can change the documentation meaning.    Darliss Ridgel 09/17/22 1327    Fransico Meadow, MD 09/17/22 [phone]

## 2022-09-17 NOTE — Discharge Instructions (Addendum)
Read the information about allergic conjunctivitis attached to these discharge papers.  I have also attached a log for you to keep your blood pressure before you see your PCP.  Cetirizine and fexofenadine or other antihistamines.  These are the generic names for Zyrtec and Allegra.  Try 1 of these instead of your Claritin.  Additionally give your dog a bath and make sure to change all linens in your house.  Follow-up with the PCP about your conjunctivitis.  It was a pleasure to meet you and your work note is attached.

## 2023-10-02 ENCOUNTER — Ambulatory Visit: Payer: Medicaid Other | Admitting: Plastic Surgery

## 2023-10-02 ENCOUNTER — Encounter: Payer: Self-pay | Admitting: Plastic Surgery

## 2023-10-02 VITALS — BP 144/98 | HR 83 | Ht 66.0 in | Wt 205.2 lb

## 2023-10-02 DIAGNOSIS — Z6833 Body mass index (BMI) 33.0-33.9, adult: Secondary | ICD-10-CM | POA: Diagnosis not present

## 2023-10-02 DIAGNOSIS — N62 Hypertrophy of breast: Secondary | ICD-10-CM

## 2023-10-02 DIAGNOSIS — M542 Cervicalgia: Secondary | ICD-10-CM | POA: Diagnosis not present

## 2023-10-02 DIAGNOSIS — M546 Pain in thoracic spine: Secondary | ICD-10-CM

## 2023-10-02 NOTE — Progress Notes (Signed)
Referring Provider Donato Schultz, FNP [address] [place],  [place] [address]   CC:  Chief Complaint  Patient presents with   Consult      [name] is an 42 y.o. female.  HPI: [name] is a 42 year old female who presents today with complaints of upper back and neck pain which she has had for many years and attributes to the large size of her breast.  She also notes deep grooves in her shoulders from where her bra strap pulls and she is unable to find a bra that fits appropriately and does not cause this indentation in her shoulders.  Patient states that no matter how much weight she has tried to lose that she continues to have this pain.  She is interested in a bilateral breast reduction.  No Known Allergies  Outpatient Encounter Medications as of 10/02/2023  Medication Sig   naproxen (NAPROSYN) 500 MG tablet Take 1 tablet (500 mg total) by mouth 2 (two) times daily with a meal.   No facility-administered encounter medications on file as of 10/02/2023.     No past medical history on file.  Past Surgical History:  Procedure Laterality Date   CESAREAN SECTION      Family History  Problem Relation Age of Onset   Hypertension Mother     Social History   Social History Narrative   Not on file     Review of Systems General: Denies fevers, chills, weight loss CV: Denies chest pain, shortness of breath, palpitations Breast: Patient denies any specific complaints with the breast other than the large size causing upper back and neck discomfort.  Physical Exam    10/02/2023    2:42 PM 09/17/2022    2:05 PM 09/17/2022   10:13 AM  Vitals with BMI  Height 5\' 6"   5\' 6"   Weight 205 lbs 3 oz  192 lbs  BMI 33.14  31  Systolic 144 150   Diastolic 98 92   Pulse 83      General:  No acute distress,  Alert and oriented, Non-Toxic, Normal speech and affect Breast: Patient has extremely large extremely dense extremely heavy breasts with grade 3 ptosis.  There are  no dominant masses on physical exam, the nipples are normal in appearance without evidence of nipple discharge today.  Her sternal notch to nipple distance on the right is 40 cm and 40 cm on the left her fold to nipple distance on the right is 19 cm and 20 cm on the left. Mammogram: Patient has not begun mammographic screening.  She will turn 42 this month and I have asked that she have a mammogram prior to her breast reduction. Assessment/Plan Macromastia: Patient has extremely heavy breasts and would clearly benefit from a bilateral breast reduction.  I believe that I can remove 900 g per breast.  I discussed breast reductions at length with the patient including showing her the location of the incisions.  We discussed the unpredictable nature of scarring and wound healing.  We discussed the risks of bleeding, infection, and seroma formation.  She understands I will use drains postoperatively to help decrease the risk of seroma formation.  We also discussed the risk of nipple loss due to nipple ischemia.  She is at a slightly higher risk for this due to the large size of her breast.  We discussed the possible change in nipple sensation with breast reduction.  We discussed the postoperative limitations which include no heavy  lifting greater than 20 pounds, no vigorous activity, and no submerging the incisions in water for 6 weeks.  The patient understands she will need to wear a compressive supportive garment for the entire 6 weeks.  He may return to light activity as tolerated.  We discussed the importance of ambulating immediately after surgery to help decrease the risk of DVT.  All questions were answered to the patient's satisfaction.  Photographs were obtained today with her consent.  Will submit her for a bilateral breast reduction at her request.  [name] 10/02/2023, 4:55 PM

## 2023-10-03 ENCOUNTER — Other Ambulatory Visit: Payer: Self-pay | Admitting: Plastic Surgery

## 2023-10-03 DIAGNOSIS — Z1231 Encounter for screening mammogram for malignant neoplasm of breast: Secondary | ICD-10-CM

## 2023-10-29 ENCOUNTER — Ambulatory Visit
Admission: RE | Admit: 2023-10-29 | Discharge: 2023-10-29 | Disposition: A | Discharge status: Home / Self Care | Payer: Medicaid Other | Source: Ambulatory Visit | Attending: Plastic Surgery | Admitting: Plastic Surgery

## 2023-10-29 DIAGNOSIS — Z1231 Encounter for screening mammogram for malignant neoplasm of breast: Secondary | ICD-10-CM

## 2023-12-11 NOTE — Progress Notes (Signed)
Patient ID: [name], female    DOB: [birth year], 42 y.o.   MRN: [id number]  Chief Complaint  Patient presents with   Pre-op Exam      ICD-10-CM   1. Macromastia  N62        History of Present Illness: [name] is a 42 y.o.  female  with a history of macromastia.  She presents for preoperative evaluation for upcoming procedure, bilateral breast reduction, scheduled for 01/06/2024 with Dr.  Ladona Ridgel .  The patient has not had problems with anesthesia.  3 prior C-sections and a tubal ligation without complication from anesthesia.  She used to vape non nicotine products, but quit several months ago with no plans to resume.  The only medication that she takes with any regularity is Flexeril prescribed by her PCP for chronic upper back/neck discomfort likely secondary to her macromastia.  She states that her mother and daughter will each be assisting with her postoperative recovery.  Family history of DVT notable in her mother, no clear provoking factor.  No other family history of blood clots.  Patient denies any personal history of DVT/clots, cardiac or pulmonary disease, nicotine use, varicosities, cancer, inflammatory bowel disease, or previous requisite for anticoagulation.  NKDA.  Denies any keloiding with C-section surgeries.  Confirms that she is a G/H cup and like to be as small as possible.  Patient understands the limitations with how much tissue can be safely excised.  Discussed nipple areolar necrosis as well as possible need for intraoperative transition to free nipple graft/amputation technique.  She states that she discussed this with Dr. Ladona Ridgel, as well.  She understands what that postoperative course would involve.  Summary of Previous Visit: Patient was seen for consult by Dr. Ladona Ridgel 10/02/2023.  At that time, complained of chronic upper back and neck discomfort in the context of large breasts.  She expressed interest in breast reduction surgery.  STN 40 cm each side.   Estimated tissue to be removed at time of surgery close 900 g each side.  Discussed possibility of nipple necrosis as well as need for drains.  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  3 weeks continuous leave if able to return with lifting restrictions.  Otherwise, 6 weeks FMLA/STD.  PMH Significant for: Macromastia, h/o C-sections.   Past Medical History: Allergies: No Known Allergies  Current Medications:  Current Outpatient Medications:    cyclobenzaprine (FLEXERIL) 5 MG tablet, Take 5 mg by mouth 3 (three) times daily as needed., Disp: , Rfl:   Past Medical Problems: No past medical history on file.  Past Surgical History: Past Surgical History:  Procedure Laterality Date   CESAREAN SECTION      Social History: Social History   Socioeconomic History   Marital status: Single    Spouse name: Not on file   Number of children: Not on file   Years of education: Not on file   Highest education level: Not on file  Occupational History   Not on file  Tobacco Use   Smoking status: Never   Smokeless tobacco: Not on file  Vaping Use   Vaping status: Some Days  Substance and Sexual Activity   Alcohol use: Yes   Drug use: No   Sexual activity: Not on file  Other Topics Concern   Not on file  Social History Narrative   Not on file   Social Drivers of Health   Financial Resource Strain: At  Risk (09/03/2023)   Received from [employer] Resource Strain: 2  Food Insecurity: Not at Risk (09/03/2023)   Received from [employer]    Food: 1  Transportation Needs: Not at Risk (09/03/2023)   Received from [employer] Needs    Transportation: 1  Physical Activity: Not on File (08/06/2023)   Received from [facility]   Physical Activity    Physical Activity: 0  Stress: Not on File (08/06/2023)   Received from [facility]   Stress    Stress: 0  Social Connections: Not on File (09/01/2023)    Received from [employer] [facility]   Social Connections    Connectedness: 0  Intimate Partner Violence: Not on file    Family History: Family History  Problem Relation Age of Onset   Hypertension Mother     Review of Systems: ROS Denies any recent chest pain, difficulty breathing, leg swelling, fevers.  Physical Exam: Vital Signs BP 128/88 (BP Location: Left Arm, Patient Position: Sitting, Cuff Size: Large)   Pulse 82   SpO2 98%   Physical Exam Constitutional:      General: Not in acute distress.    Appearance: Normal appearance. Not ill-appearing.  HENT:     Head: Normocephalic and atraumatic.  Eyes:     Pupils: Pupils are equal, round. Cardiovascular:     Rate and Rhythm: Normal rate.    Pulses: Normal pulses.  Pulmonary:     Effort: No respiratory distress or increased work of breathing.  Speaks in full sentences. Abdominal:     General: Abdomen is flat. No distension.   Musculoskeletal: Normal range of motion. No lower extremity swelling or edema. No varicosities. Skin:    General: Skin is warm and dry.     Findings: No erythema or rash.  Neurological:     Mental Status: Alert and oriented to person, place, and time.  Psychiatric:        Mood and Affect: Mood normal.        Behavior: Behavior normal.    Assessment/Plan: The patient is scheduled for bilateral breast reduction with Dr.  Ladona Ridgel .  Risks, benefits, and alternatives of procedure discussed, questions answered and consent obtained.    Smoking Status: Former smoker. Last Mammogram: N/A due to age.  Caprini Score: 6; Risk Factors include: BMI greater than 25, family history of thrombosis (mother), and length of planned surgery. Recommendation for mechanical prophylaxis. Encourage early ambulation.   Pictures obtained: 10/02/2023  Post-op Rx sent to pharmacy: Oxycodone and Zofran.  Discussed not combining oxycodone with Flexeril.  Patient was provided with the General Surgical Risk consent document and Pain  Medication Agreement prior to their appointment.  They had adequate time to read through the risk consent documents and Pain Medication Agreement. We also discussed them in person together during this preop appointment. All of their questions were answered to their satisfaction.  Recommended calling if they have any further questions.  Risk consent form and Pain Medication Agreement to be scanned into patient's chart.  The risk that can be encountered with breast reduction were discussed and include the following but not limited to these:  Breast asymmetry, fluid accumulation, firmness of the breast, inability to breast feed, loss of nipple or areola, skin loss, decrease or no nipple sensation, fat necrosis of the breast tissue, bleeding, infection, healing delay.  There are risks of anesthesia, changes to skin sensation and injury to nerves or blood vessels.  The muscle can be temporarily or permanently injured.  You may have an allergic reaction to tape, suture, glue, blood products which can result in skin discoloration, swelling, pain, skin lesions, poor healing.  Any of these can lead to the need for revisonal surgery or stage procedures.  A reduction has potential to interfere with diagnostic procedures.  Nipple or breast piercing can increase risks of infection.  This procedure is best done when the breast is fully developed.  Changes in the breast will continue to occur over time.  Pregnancy can alter the outcomes of previous breast reduction surgery, weight gain and weigh loss can also effect the long term appearance.   We discussed the possibility of amputation/free nipple graft technique due to the length of her STN.  She is understanding of the possibility that we would need to transition from a pedicle technique to a free nipple graft technique intraoperatively.  We discussed the risks associated with free nipple graft breast reductions, including but not limited to failure of the graft, partial  loss of the graft, loss of sensation of bilateral nipple areola, complete loss of the nipple areola graft, inability to breast-feed, postoperative wounds, ongoing wound care.  We also discussed the risks associated with the pedicle technique.  We discussed that with the pedicle technique she could develop nipple areolar necrosis which would result in loss of the nipple, this would also result in ongoing wound care and possible changes in the shape of her breast.     Electronically signed by: Evelena Leyden, PA-C 12/12/2023 9:56 AM

## 2023-12-12 ENCOUNTER — Ambulatory Visit (INDEPENDENT_AMBULATORY_CARE_PROVIDER_SITE_OTHER): Payer: Medicaid Other | Admitting: Physician Assistant

## 2023-12-12 VITALS — BP 128/88 | HR 82

## 2023-12-12 DIAGNOSIS — N62 Hypertrophy of breast: Secondary | ICD-10-CM

## 2023-12-12 MED ORDER — OXYCODONE HCL 5 MG PO TABS: 5.0000 mg | ORAL_TABLET | Freq: Three times a day (TID) | ORAL | 0 refills | Status: AC | PRN

## 2023-12-12 MED ORDER — ONDANSETRON 4 MG PO TBDP: 4.0000 mg | ORAL_TABLET | Freq: Three times a day (TID) | ORAL | 0 refills | Status: DC | PRN

## 2023-12-29 ENCOUNTER — Encounter (HOSPITAL_COMMUNITY): Payer: Self-pay

## 2023-12-29 ENCOUNTER — Ambulatory Visit (HOSPITAL_COMMUNITY)
Admission: EM | Admit: 2023-12-29 | Discharge: 2023-12-29 | Disposition: A | Discharge status: Home / Self Care | Payer: Medicaid Other | Attending: Family Medicine | Admitting: Family Medicine

## 2023-12-29 ENCOUNTER — Encounter (HOSPITAL_BASED_OUTPATIENT_CLINIC_OR_DEPARTMENT_OTHER): Payer: Self-pay | Admitting: Plastic Surgery

## 2023-12-29 DIAGNOSIS — J069 Acute upper respiratory infection, unspecified: Secondary | ICD-10-CM | POA: Diagnosis not present

## 2023-12-29 DIAGNOSIS — R051 Acute cough: Secondary | ICD-10-CM

## 2023-12-29 NOTE — ED Provider Notes (Signed)
 [facility]    CSN: [id number] Arrival date & time: 12/29/23  1017      History   Chief Complaint Chief Complaint  Patient presents with   Cough    HPI [name] is a 43 y.o. female.   Patient presenting for 1 week history of cough.  Patient states that she has been coughing for the past 1 week without any other symptoms.  Patient is a dry cough.  Patient states the cough is worse at night.  Patient does note that she has surgery next week for bilateral breast reduction.  No other concerns at this time.   Cough   History reviewed. No pertinent past medical history.  There are no active problems to display for this patient.   Past Surgical History:  Procedure Laterality Date   CESAREAN SECTION      OB History   No obstetric history on file.      Home Medications    Prior to Admission medications   Medication Sig Start Date End Date Taking? Authorizing Provider  cyclobenzaprine (FLEXERIL) 5 MG tablet Take 5 mg by mouth 3 (three) times daily as needed. 09/03/23   [provider]  ondansetron  (ZOFRAN -ODT) 4 MG disintegrating tablet Take 1 tablet (4 mg total) by mouth every 8 (eight) hours as needed for nausea or vomiting. 12/12/23   Landy Honora CROME, PA-C    Family History Family History  Problem Relation Age of Onset   Hypertension Mother     Social History Social History   Tobacco Use   Smoking status: Never  Vaping Use   Vaping status: Some Days  Substance Use Topics   Alcohol use: Yes   Drug use: No     Allergies   Patient has no known allergies.   Review of Systems Review of Systems  Respiratory:  Positive for cough.      Physical Exam Triage Vital Signs ED Triage Vitals  Encounter Vitals Group     BP 12/29/23 1126 (!) 140/108     Systolic BP Percentile --      Diastolic BP Percentile --      Pulse Rate 12/29/23 1125 95     Resp 12/29/23 1125 18     Temp 12/29/23 1125 98.9 F (37.2 C)     Temp Source  12/29/23 1125 Oral     SpO2 12/29/23 1125 99 %     Weight --      Height --      Head Circumference --      Peak Flow --      Pain Score 12/29/23 1124 3     Pain Loc --      Pain Education --      Exclude from Growth Chart --    No data found.  Updated Vital Signs BP (!) 140/108   Pulse 95   Temp 98.9 F (37.2 C) (Oral)   Resp 18   LMP 12/29/2023 (Exact Date)   SpO2 99%   Visual Acuity Right Eye Distance:   Left Eye Distance:   Bilateral Distance:    Right Eye Near:   Left Eye Near:    Bilateral Near:     Physical Exam Constitutional:      Appearance: Normal appearance.  HENT:     Right Ear: Tympanic membrane, ear canal and external ear normal.     Left Ear: Tympanic membrane, ear canal and external ear normal. There is no impacted cerumen.  Mouth/Throat:     Mouth: Mucous membranes are moist.     Pharynx: Oropharynx is clear. No posterior oropharyngeal erythema.  Cardiovascular:     Rate and Rhythm: Normal rate and regular rhythm.  Pulmonary:     Breath sounds: No wheezing.  Neurological:     Mental Status: She is alert.      UC Treatments / Results  Labs (all labs ordered are listed, but only abnormal results are displayed) Labs Reviewed - No data to display  EKG   Radiology No results found.  Procedures Procedures (including critical care time)  Medications Ordered in UC Medications - No data to display  Initial Impression / Assessment and Plan / UC Course  I have reviewed the triage vital signs and the nursing notes.  Pertinent labs & imaging results that were available during my care of the patient were reviewed by me and considered in my medical decision making (see chart for details).     Patient likely dealing with a viral cough.  Discussed with patient that it would not be beneficial at this time to start a new medication given that she may be having surgery in 1 week.  Also advised patient to follow-up with the surgeon to make  sure that they are aware that she is likely dealing with a viral cough.  Discussed with patient that sometimes these things can last anywhere between 3 to 4 weeks.  Advised patient that over-the-counter supplements are as effective as anything prescribed.  Patient is understanding and agreeable and will reach out to her surgeon. Final Clinical Impressions(s) / UC Diagnoses   Final diagnoses:  Acute cough  Viral URI with cough     Discharge Instructions      Please call your surgeon to let them know that you are dealing with a viral cough though it does not appear to be concerning at this time.  You may continue trying over-the-counter remedies as well as home remedies to see what works for your cough.     ED Prescriptions   None    PDMP not reviewed this encounter.   Vita Morrow, MD 12/29/23 1149

## 2023-12-29 NOTE — ED Triage Notes (Signed)
 Pt presents with a cough X 8 days.  C/o bilateral ear pain when coughing a lot Home interventions: Robitussin and hot teas

## 2023-12-29 NOTE — Discharge Instructions (Addendum)
 Please call your surgeon to let them know that you are dealing with a viral cough though it does not appear to be concerning at this time.  You may continue trying over-the-counter remedies as well as home remedies to see what works for your cough.

## 2024-01-06 ENCOUNTER — Other Ambulatory Visit: Payer: Self-pay

## 2024-01-06 ENCOUNTER — Ambulatory Visit (HOSPITAL_COMMUNITY)
Admission: EM | Admit: 2024-01-06 | Discharge: 2024-01-06 | Disposition: A | Discharge status: Home / Self Care | Payer: Medicaid Other | Attending: Emergency Medicine | Admitting: Emergency Medicine

## 2024-01-06 ENCOUNTER — Encounter (HOSPITAL_COMMUNITY): Payer: Self-pay | Admitting: *Deleted

## 2024-01-06 ENCOUNTER — Ambulatory Visit (HOSPITAL_BASED_OUTPATIENT_CLINIC_OR_DEPARTMENT_OTHER): Admission: RE | Admit: 2024-01-06 | Payer: Medicaid Other | Source: Home / Self Care | Admitting: Plastic Surgery

## 2024-01-06 DIAGNOSIS — J209 Acute bronchitis, unspecified: Secondary | ICD-10-CM

## 2024-01-06 SURGERY — MAMMARY REDUCTION  (BREAST)
Anesthesia: Choice | Laterality: Bilateral

## 2024-01-06 MED ORDER — ALBUTEROL SULFATE HFA 108 (90 BASE) MCG/ACT IN AERS
INHALATION_SPRAY | RESPIRATORY_TRACT | Status: AC
  Filled 2024-01-06: qty 6.7

## 2024-01-06 MED ORDER — ALBUTEROL SULFATE (2.5 MG/3ML) 0.083% IN NEBU
2.5000 mg | INHALATION_SOLUTION | Freq: Once | RESPIRATORY_TRACT | Status: AC
  Administered 2024-01-06: 2.5 mg via RESPIRATORY_TRACT

## 2024-01-06 MED ORDER — ALBUTEROL SULFATE HFA 108 (90 BASE) MCG/ACT IN AERS
2.0000 | INHALATION_SPRAY | Freq: Once | RESPIRATORY_TRACT | Status: AC
  Administered 2024-01-06: 2 via RESPIRATORY_TRACT

## 2024-01-06 MED ORDER — AEROCHAMBER PLUS FLO-VU MEDIUM MISC
1.0000 | Freq: Once | Status: AC
  Administered 2024-01-06: 1

## 2024-01-06 MED ORDER — PREDNISONE 10 MG (21) PO TBPK: ORAL_TABLET | ORAL | 0 refills | Status: DC

## 2024-01-06 MED ORDER — AEROCHAMBER PLUS FLO-VU LARGE MISC
Status: AC
  Filled 2024-01-06: qty 1

## 2024-01-06 MED ORDER — ALBUTEROL SULFATE (2.5 MG/3ML) 0.083% IN NEBU
INHALATION_SOLUTION | RESPIRATORY_TRACT | Status: AC
  Filled 2024-01-06: qty 3

## 2024-01-06 MED ORDER — AZITHROMYCIN 250 MG PO TABS: 250.0000 mg | ORAL_TABLET | Freq: Every day | ORAL | 0 refills | Status: DC

## 2024-01-06 NOTE — ED Provider Notes (Signed)
[facility]    CSN: [id number] Arrival date & time: 01/06/24  1510      History   Chief Complaint Chief Complaint  Patient presents with   Cough   Generalized Body Aches    HPI [name] is a 43 y.o. female. Has been sick for about 16 days. Was seen here in urgent care on 1/13 for the same, told had viral illness. Felt like she was getting better so went to work today, now feels worse again. SOB, coughing, body aches. No hx asthma. Doesn't smoke. Does not really have nasal sx anymore, maybe a little runny nose.    Cough   History reviewed. No pertinent past medical history.  There are no active problems to display for this patient.   Past Surgical History:  Procedure Laterality Date   CESAREAN SECTION      OB History   No obstetric history on file.      Home Medications    Prior to Admission medications   Medication Sig Start Date End Date Taking? Authorizing Provider  azithromycin (ZITHROMAX) 250 MG tablet Take 1 tablet (250 mg total) by mouth daily. Take first 2 tablets together, then 1 every day until finished. 01/06/24  Yes Meldon Hanzlik, Marzella Schlein, NP  predniSONE (STERAPRED UNI-PAK 21 TAB) 10 MG (21) TBPK tablet Take 6 tabs on day 1; take 5 tabs day 2; take 4 tabs day 3; take 3 tabs day 4; take 2 tabs day 5; take 1 tab day 6 01/06/24  Yes Theador Jezewski, Marzella Schlein, NP  cyclobenzaprine (FLEXERIL) 5 MG tablet Take 5 mg by mouth 3 (three) times daily as needed. 09/03/23   [provider]  ondansetron (ZOFRAN-ODT) 4 MG disintegrating tablet Take 1 tablet (4 mg total) by mouth every 8 (eight) hours as needed for nausea or vomiting. 12/12/23   Lorelee New, PA-C    Family History Family History  Problem Relation Age of Onset   Hypertension Mother     Social History Social History   Tobacco Use   Smoking status: Never  Vaping Use   Vaping status: Some Days  Substance Use Topics   Alcohol use: Yes    Comment: rare   Drug use: No     Allergies    Patient has no known allergies.   Review of Systems Review of Systems  Respiratory:  Positive for cough.      Physical Exam Triage Vital Signs ED Triage Vitals  Encounter Vitals Group     BP 01/06/24 1543 (!) 148/89     Systolic BP Percentile --      Diastolic BP Percentile --      Pulse Rate 01/06/24 1543 84     Resp 01/06/24 1543 (!) 96     Temp 01/06/24 1543 98 F (36.7 C)     Temp src --      SpO2 01/06/24 1543 96 %     Weight --      Height --      Head Circumference --      Peak Flow --      Pain Score 01/06/24 1541 8     Pain Loc --      Pain Education --      Exclude from Growth Chart --    No data found.  Updated Vital Signs BP (!) 148/89   Pulse 84   Temp 98 F (36.7 C)   Resp (!) 96   LMP 12/29/2023 (Exact Date)  SpO2 96%   Visual Acuity Right Eye Distance:   Left Eye Distance:   Bilateral Distance:    Right Eye Near:   Left Eye Near:    Bilateral Near:     Physical Exam Constitutional:      Appearance: Normal appearance. She is not ill-appearing.  Cardiovascular:     Rate and Rhythm: Normal rate and regular rhythm.  Pulmonary:     Effort: Pulmonary effort is normal.     Breath sounds: Rhonchi present.     Comments: Wet sounding cough Neurological:     Mental Status: She is alert.      UC Treatments / Results  Labs (all labs ordered are listed, but only abnormal results are displayed) Labs Reviewed - No data to display  EKG   Radiology No results found.  Procedures Procedures (including critical care time)  Medications Ordered in UC Medications  albuterol (PROVENTIL) (2.5 MG/3ML) 0.083% nebulizer solution 2.5 mg (2.5 mg Nebulization Given 01/06/24 1619)  albuterol (VENTOLIN HFA) 108 (90 Base) MCG/ACT inhaler 2 puff (2 puffs Inhalation Given 01/06/24 1702)  AeroChamber Plus Flo-Vu Medium MISC 1 each (1 each Other Given 01/06/24 1703)    Initial Impression / Assessment and Plan / UC Course  I have reviewed the triage  vital signs and the nursing notes.  Pertinent labs & imaging results that were available during my care of the patient were reviewed by me and considered in my medical decision making (see chart for details).    Lung sounds clear after alb nebs. Pt given albuterol inhaler and spacer and instructed by RN on how to use - she has never needed to use one before.   I suspect this was a viral illness and now she has a 2ndary bacterial infection given the double sickening pattern and length of sx.   Final Clinical Impressions(s) / UC Diagnoses   Final diagnoses:  Acute bronchitis, unspecified organism     Discharge Instructions      Use albuterol inhaler 2 puffs every 4-6 hours if needed for shortness of breath/wheezing feeling.   Start the prednisone tomorrow morning. I would start the azithromycin tonight (antibiotic).   Mucinex DM (or generic) is over the counter and can help your cough as you heal.      ED Prescriptions     Medication Sig Dispense Auth. Provider   azithromycin (ZITHROMAX) 250 MG tablet Take 1 tablet (250 mg total) by mouth daily. Take first 2 tablets together, then 1 every day until finished. 6 tablet Cathlyn Parsons, NP   predniSONE (STERAPRED UNI-PAK 21 TAB) 10 MG (21) TBPK tablet Take 6 tabs on day 1; take 5 tabs day 2; take 4 tabs day 3; take 3 tabs day 4; take 2 tabs day 5; take 1 tab day 6 21 tablet Hussien Greenblatt, Marzella Schlein, NP      PDMP not reviewed this encounter.   Cathlyn Parsons, NP 01/06/24 1710

## 2024-01-06 NOTE — Discharge Instructions (Signed)
Use albuterol inhaler 2 puffs every 4-6 hours if needed for shortness of breath/wheezing feeling.   Start the prednisone tomorrow morning. I would start the azithromycin tonight (antibiotic).   Mucinex DM (or generic) is over the counter and can help your cough as you heal.

## 2024-01-06 NOTE — ED Triage Notes (Signed)
Pt reports she has a cough and body aches. Pt had her surgery canceled because of cough and body aches.

## 2024-01-07 ENCOUNTER — Telehealth: Payer: Self-pay | Admitting: Plastic Surgery

## 2024-01-07 ENCOUNTER — Encounter: Payer: Medicaid Other | Admitting: Plastic Surgery

## 2024-01-07 NOTE — Telephone Encounter (Signed)
Pt called asking when she can call back to r/s sx, we let her know once she is symptom free to call the office back

## 2024-01-12 ENCOUNTER — Telehealth: Payer: Self-pay | Admitting: Plastic Surgery

## 2024-01-12 NOTE — Telephone Encounter (Signed)
[name], [name] [id number] sx was 01-06-24, pt was sick and wants to know if she can r/s, Told pt she would get a call back per sx sch

## 2024-01-12 NOTE — Telephone Encounter (Signed)
Pt called back again wanting to r/s her sx, I let her know I would put in another message and the sx sch can call when they are free

## 2024-01-23 ENCOUNTER — Encounter: Payer: Self-pay | Admitting: Surgical

## 2024-01-23 ENCOUNTER — Ambulatory Visit (INDEPENDENT_AMBULATORY_CARE_PROVIDER_SITE_OTHER): Payer: Medicaid Other | Admitting: Surgical

## 2024-01-23 VITALS — BP 157/92 | HR 98 | Ht 66.0 in | Wt 197.0 lb

## 2024-01-23 DIAGNOSIS — N62 Hypertrophy of breast: Secondary | ICD-10-CM

## 2024-01-23 NOTE — Progress Notes (Signed)
 Patient ID: [name], female    DOB: 1981-06-07, 43 y.o.   MRN: [id number]  Chief Complaint  Patient presents with   Pre-op Exam      ICD-10-CM   1. Macromastia  N62      History of Present Illness: [name] is a 43 y.o.  female  with a history of macromastia.  She presents for preoperative evaluation for upcoming procedure, Bilateral Breast Reduction, scheduled for 02/10/2024 with Dr.  Waddell  Of note patient did have a preop on 12/12/2023, however surgery was postponed due to patient developing a cold.  She reports she is feeling much better, she did receive a p.o. steroid, but finished this January 21.  The patient has not had problems with anesthesia. No history of DVT/PE.    No family or personal history of bleeding or clotting disorders.  Patient is not currently taking any blood thinners.  No history of CVA/MI.  She does have a family history of DVT-her mother with no clear provoking factor.  No other known family history of blood clots.  No personal history of DVT/VTE.  No cardiac or pulmonary disease.  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  We discussed 4 weeks out of work to assist with recovery.  Patient does not have any significant past medical history, she only takes Flexeril as needed.  Denies any cardiac or pulmonary disease.  Since her recovery from her cold she is feeling much better.  She does not have any specific questions about surgery, she had a previous preop 2 months ago.  Past Medical History: Allergies: No Known Allergies  Current Medications:  Current Outpatient Medications:    cyclobenzaprine (FLEXERIL) 5 MG tablet, Take 5 mg by mouth 3 (three) times daily as needed., Disp: , Rfl:    azithromycin  (ZITHROMAX ) 250 MG tablet, Take 1 tablet (250 mg  total) by mouth daily. Take first 2 tablets together, then 1 every day until finished., Disp: 6 tablet, Rfl: 0   ondansetron  (ZOFRAN -ODT) 4 MG disintegrating tablet, Take 1 tablet (4 mg total) by mouth every 8 (eight) hours as needed for nausea or vomiting., Disp: 20 tablet, Rfl: 0   predniSONE  (STERAPRED UNI-PAK 21 TAB) 10 MG (21) TBPK tablet, Take 6 tabs on day 1; take 5 tabs day 2; take 4 tabs day 3; take 3 tabs day 4; take 2 tabs day 5; take 1 tab day 6, Disp: 21 tablet, Rfl: 0  Past Medical Problems: No past medical history on file.  Past Surgical History: Past Surgical History:  Procedure Laterality Date   CESAREAN SECTION      Social History: Social History   Socioeconomic History   Marital status: Single    Spouse name: Not on file   Number of children: Not on file   Years of education: Not on file   Highest education level: Not on file  Occupational History   Not on file  Tobacco Use   Smoking status: Never   Smokeless tobacco: Not on file  Vaping Use   Vaping status: Some Days  Substance and Sexual Activity   Alcohol use: Yes    Comment: rare   Drug use: No   Sexual activity: Not on file  Other Topics Concern  Not on file  Social History Narrative   Not on file   Social Drivers of Health   Financial Resource Strain: At Risk (09/03/2023)   Received from [employer]    Financial Resource Strain: 2  Food Insecurity: Not at Risk (09/03/2023)   Received from [employer]    Food: 1  Transportation Needs: Not at Risk (09/03/2023)   Received from [employer] [facility]    Transportation: 1  Physical Activity: Not on File (08/06/2023)   Received from [facility]   Physical Activity    Physical Activity: 0  Stress: Not on File (08/06/2023)   Received from [facility]   Stress    Stress: 0  Social Connections: Not on File (09/01/2023)   Received from [employer]   Social Connections    Connectedness: 0  Intimate Partner Violence: Not on  file    Family History: Family History  Problem Relation Age of Onset   Hypertension Mother     Review of Systems: Review of Systems  Constitutional: Negative.   Respiratory: Negative.    Cardiovascular: Negative.   Gastrointestinal: Negative.     Physical Exam: Vital Signs BP (!) 157/92 (BP Location: Left Arm, Patient Position: Sitting, Cuff Size: Normal)   Pulse 98   Ht 5' 6 (1.676 m)   Wt 197 lb (89.4 kg)   LMP 12/29/2023 (Exact Date)   SpO2 97%   BMI 31.80 kg/m   Physical Exam Constitutional:      General: Not in acute distress.    Appearance: Normal appearance. Not ill-appearing.  HENT:     Head: Normocephalic and atraumatic.  Eyes:     Pupils: Pupils are equal, round Neck:     Musculoskeletal: Normal range of motion.  Cardiovascular:     Rate and Rhythm: Normal rate    Pulses: Normal pulses.  Pulmonary:     Effort: Pulmonary effort is normal. No respiratory distress.  Musculoskeletal: Normal range of motion.  Skin:    General: Skin is warm and dry.     Findings: No erythema or rash.  Neurological:     General: No focal deficit present.     Mental Status: Alert and oriented to person, place, and time. Mental status is at baseline.     Motor: No weakness.  Psychiatric:        Mood and Affect: Mood normal.        Behavior: Behavior normal.    Assessment/Plan: The patient is scheduled for bilateral breast reduction with Dr. Waddell.  Risks, benefits, and alternatives of procedure discussed, questions answered and consent obtained.    Smoking Status: former-smoker; Counseling Given?  N/A Patient had mammogram November 2024 which was negative, BI-RADS 1.  Caprini Score: 6; Risk Factors include: BMI > 25, mother with history of DVT and length of planned surgery. Recommendation for mechanical prophylaxis. Encourage early ambulation.   Pictures obtained: @consult   Post-op Rx sent to pharmacy:  Prescriptions previously picked up  Patient was provided  with the breast reduction and General Surgical Risk consent document and Pain Medication Agreement prior to their appointment.  They had adequate time to read through the risk consent documents and Pain Medication Agreement. We also discussed them in person together during this preop appointment. All of their questions were answered to their satisfaction.  Recommended calling if they have any further questions.  Risk consent form and Pain Medication Agreement to be scanned into patient's chart.  The risk  that can be encountered with breast reduction were discussed and include the following but not limited to these:  Breast asymmetry, fluid accumulation, firmness of the breast, inability to breast feed, loss of nipple or areola, skin loss, decrease or no nipple sensation, fat necrosis of the breast tissue, bleeding, infection, healing delay.  There are risks of anesthesia, changes to skin sensation and injury to nerves or blood vessels.  The muscle can be temporarily or permanently injured.  You may have an allergic reaction to tape, suture, glue, blood products which can result in skin discoloration, swelling, pain, skin lesions, poor healing.  Any of these can lead to the need for revisonal surgery or stage procedures.  A reduction has potential to interfere with diagnostic procedures.  Nipple or breast piercing can increase risks of infection.  This procedure is best done when the breast is fully developed.  Changes in the breast will continue to occur over time.  Pregnancy can alter the outcomes of previous breast reduction surgery, weight gain and weigh loss can also effect the long term appearance.   We did discuss the possibility of amputation/free nipple graft technique again due to the length of her STN.  She did not have any questions about this procedure as she previously discussed it with Honora Seip, PA-C at her previous preop.  Electronically signed by: Donnice PARAS Marshayla Mitschke, PA-C 01/23/2024 2:25  PM

## 2024-01-23 NOTE — H&P (View-Only) (Signed)
 Patient ID: [name], female    DOB: 1981-06-07, 43 y.o.   MRN: [id number]  Chief Complaint  Patient presents with   Pre-op Exam      ICD-10-CM   1. Macromastia  N62      History of Present Illness: [name] is a 43 y.o.  female  with a history of macromastia.  She presents for preoperative evaluation for upcoming procedure, Bilateral Breast Reduction, scheduled for 02/10/2024 with Dr.  Waddell  Of note patient did have a preop on 12/12/2023, however surgery was postponed due to patient developing a cold.  She reports she is feeling much better, she did receive a p.o. steroid, but finished this January 21.  The patient has not had problems with anesthesia. No history of DVT/PE.    No family or personal history of bleeding or clotting disorders.  Patient is not currently taking any blood thinners.  No history of CVA/MI.  She does have a family history of DVT-her mother with no clear provoking factor.  No other known family history of blood clots.  No personal history of DVT/VTE.  No cardiac or pulmonary disease.  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  We discussed 4 weeks out of work to assist with recovery.  Patient does not have any significant past medical history, she only takes Flexeril as needed.  Denies any cardiac or pulmonary disease.  Since her recovery from her cold she is feeling much better.  She does not have any specific questions about surgery, she had a previous preop 2 months ago.  Past Medical History: Allergies: No Known Allergies  Current Medications:  Current Outpatient Medications:    cyclobenzaprine (FLEXERIL) 5 MG tablet, Take 5 mg by mouth 3 (three) times daily as needed., Disp: , Rfl:    azithromycin  (ZITHROMAX ) 250 MG tablet, Take 1 tablet (250 mg  total) by mouth daily. Take first 2 tablets together, then 1 every day until finished., Disp: 6 tablet, Rfl: 0   ondansetron  (ZOFRAN -ODT) 4 MG disintegrating tablet, Take 1 tablet (4 mg total) by mouth every 8 (eight) hours as needed for nausea or vomiting., Disp: 20 tablet, Rfl: 0   predniSONE  (STERAPRED UNI-PAK 21 TAB) 10 MG (21) TBPK tablet, Take 6 tabs on day 1; take 5 tabs day 2; take 4 tabs day 3; take 3 tabs day 4; take 2 tabs day 5; take 1 tab day 6, Disp: 21 tablet, Rfl: 0  Past Medical Problems: No past medical history on file.  Past Surgical History: Past Surgical History:  Procedure Laterality Date   CESAREAN SECTION      Social History: Social History   Socioeconomic History   Marital status: Single    Spouse name: Not on file   Number of children: Not on file   Years of education: Not on file   Highest education level: Not on file  Occupational History   Not on file  Tobacco Use   Smoking status: Never   Smokeless tobacco: Not on file  Vaping Use   Vaping status: Some Days  Substance and Sexual Activity   Alcohol use: Yes    Comment: rare   Drug use: No   Sexual activity: Not on file  Other Topics Concern  Not on file  Social History Narrative   Not on file   Social Drivers of Health   Financial Resource Strain: At Risk (09/03/2023)   Received from [employer]    Financial Resource Strain: 2  Food Insecurity: Not at Risk (09/03/2023)   Received from [employer]    Food: 1  Transportation Needs: Not at Risk (09/03/2023)   Received from [employer] [facility]    Transportation: 1  Physical Activity: Not on File (08/06/2023)   Received from [facility]   Physical Activity    Physical Activity: 0  Stress: Not on File (08/06/2023)   Received from [facility]   Stress    Stress: 0  Social Connections: Not on File (09/01/2023)   Received from [employer]   Social Connections    Connectedness: 0  Intimate Partner Violence: Not on  file    Family History: Family History  Problem Relation Age of Onset   Hypertension Mother     Review of Systems: Review of Systems  Constitutional: Negative.   Respiratory: Negative.    Cardiovascular: Negative.   Gastrointestinal: Negative.     Physical Exam: Vital Signs BP (!) 157/92 (BP Location: Left Arm, Patient Position: Sitting, Cuff Size: Normal)   Pulse 98   Ht 5' 6 (1.676 m)   Wt 197 lb (89.4 kg)   LMP 12/29/2023 (Exact Date)   SpO2 97%   BMI 31.80 kg/m   Physical Exam Constitutional:      General: Not in acute distress.    Appearance: Normal appearance. Not ill-appearing.  HENT:     Head: Normocephalic and atraumatic.  Eyes:     Pupils: Pupils are equal, round Neck:     Musculoskeletal: Normal range of motion.  Cardiovascular:     Rate and Rhythm: Normal rate    Pulses: Normal pulses.  Pulmonary:     Effort: Pulmonary effort is normal. No respiratory distress.  Musculoskeletal: Normal range of motion.  Skin:    General: Skin is warm and dry.     Findings: No erythema or rash.  Neurological:     General: No focal deficit present.     Mental Status: Alert and oriented to person, place, and time. Mental status is at baseline.     Motor: No weakness.  Psychiatric:        Mood and Affect: Mood normal.        Behavior: Behavior normal.    Assessment/Plan: The patient is scheduled for bilateral breast reduction with Dr. Waddell.  Risks, benefits, and alternatives of procedure discussed, questions answered and consent obtained.    Smoking Status: former-smoker; Counseling Given?  N/A Patient had mammogram November 2024 which was negative, BI-RADS 1.  Caprini Score: 6; Risk Factors include: BMI > 25, mother with history of DVT and length of planned surgery. Recommendation for mechanical prophylaxis. Encourage early ambulation.   Pictures obtained: @consult   Post-op Rx sent to pharmacy:  Prescriptions previously picked up  Patient was provided  with the breast reduction and General Surgical Risk consent document and Pain Medication Agreement prior to their appointment.  They had adequate time to read through the risk consent documents and Pain Medication Agreement. We also discussed them in person together during this preop appointment. All of their questions were answered to their satisfaction.  Recommended calling if they have any further questions.  Risk consent form and Pain Medication Agreement to be scanned into patient's chart.  The risk  that can be encountered with breast reduction were discussed and include the following but not limited to these:  Breast asymmetry, fluid accumulation, firmness of the breast, inability to breast feed, loss of nipple or areola, skin loss, decrease or no nipple sensation, fat necrosis of the breast tissue, bleeding, infection, healing delay.  There are risks of anesthesia, changes to skin sensation and injury to nerves or blood vessels.  The muscle can be temporarily or permanently injured.  You may have an allergic reaction to tape, suture, glue, blood products which can result in skin discoloration, swelling, pain, skin lesions, poor healing.  Any of these can lead to the need for revisonal surgery or stage procedures.  A reduction has potential to interfere with diagnostic procedures.  Nipple or breast piercing can increase risks of infection.  This procedure is best done when the breast is fully developed.  Changes in the breast will continue to occur over time.  Pregnancy can alter the outcomes of previous breast reduction surgery, weight gain and weigh loss can also effect the long term appearance.   We did discuss the possibility of amputation/free nipple graft technique again due to the length of her STN.  She did not have any questions about this procedure as she previously discussed it with Honora Seip, PA-C at her previous preop.  Electronically signed by: Donnice PARAS Bertie Simien, PA-C 01/23/2024 2:25  PM

## 2024-01-28 ENCOUNTER — Encounter: Payer: Medicaid Other | Admitting: Physician Assistant

## 2024-02-03 ENCOUNTER — Encounter (HOSPITAL_BASED_OUTPATIENT_CLINIC_OR_DEPARTMENT_OTHER): Payer: Self-pay | Admitting: Plastic Surgery

## 2024-02-03 ENCOUNTER — Other Ambulatory Visit: Payer: Self-pay

## 2024-02-09 NOTE — Anesthesia Preprocedure Evaluation (Signed)
 Anesthesia Evaluation  Patient identified by MRN, date of birth, ID band Patient awake    Reviewed: Allergy & Precautions, NPO status , Patient's Chart, lab work & pertinent test results  Airway Mallampati: II  TM Distance: >3 FB Neck ROM: Full    Dental no notable dental hx. (+) Dental Advisory Given, Teeth Intact   Pulmonary neg pulmonary ROS   Pulmonary exam normal breath sounds clear to auscultation       Cardiovascular negative cardio ROS Normal cardiovascular exam Rhythm:Regular Rate:Normal     Neuro/Psych  PSYCHIATRIC DISORDERS Anxiety     negative neurological ROS     GI/Hepatic Neg liver ROS,GERD  Medicated and Controlled,,  Endo/Other  negative endocrine ROS    Renal/GU negative Renal ROS     Musculoskeletal negative musculoskeletal ROS (+)    Abdominal  (+) + obese  Peds  Hematology negative hematology ROS (+)   Anesthesia Other Findings   Reproductive/Obstetrics negative OB ROS                             Anesthesia Physical Anesthesia Plan  ASA: 2  Anesthesia Plan: General   Post-op Pain Management: Tylenol PO (pre-op)* and Gabapentin PO (pre-op)*   Induction: Intravenous  PONV Risk Score and Plan: 4 or greater and Ondansetron, Dexamethasone, Treatment may vary due to age or medical condition and Midazolam  Airway Management Planned: Oral ETT  Additional Equipment: None  Intra-op Plan:   Post-operative Plan: Extubation in OR  Informed Consent: I have reviewed the patients History and Physical, chart, labs and discussed the procedure including the risks, benefits and alternatives for the proposed anesthesia with the patient or authorized representative who has indicated his/her understanding and acceptance.     Dental advisory given  Plan Discussed with: CRNA  Anesthesia Plan Comments:         Anesthesia Quick Evaluation

## 2024-02-10 ENCOUNTER — Encounter (HOSPITAL_BASED_OUTPATIENT_CLINIC_OR_DEPARTMENT_OTHER): Payer: Self-pay | Admitting: Plastic Surgery

## 2024-02-10 ENCOUNTER — Ambulatory Visit (HOSPITAL_BASED_OUTPATIENT_CLINIC_OR_DEPARTMENT_OTHER)
Admission: RE | Admit: 2024-02-10 | Discharge: 2024-02-10 | Disposition: A | Discharge status: Home / Self Care | Payer: Medicaid Other | Attending: Plastic Surgery | Admitting: Plastic Surgery

## 2024-02-10 ENCOUNTER — Ambulatory Visit (HOSPITAL_BASED_OUTPATIENT_CLINIC_OR_DEPARTMENT_OTHER): Payer: Self-pay | Admitting: Anesthesiology

## 2024-02-10 ENCOUNTER — Encounter (HOSPITAL_BASED_OUTPATIENT_CLINIC_OR_DEPARTMENT_OTHER)
Admission: RE | Disposition: A | Discharge status: Home / Self Care | Payer: Self-pay | Source: Home / Self Care | Attending: Plastic Surgery

## 2024-02-10 ENCOUNTER — Other Ambulatory Visit: Payer: Self-pay

## 2024-02-10 DIAGNOSIS — K219 Gastro-esophageal reflux disease without esophagitis: Secondary | ICD-10-CM | POA: Diagnosis not present

## 2024-02-10 DIAGNOSIS — N62 Hypertrophy of breast: Secondary | ICD-10-CM

## 2024-02-10 DIAGNOSIS — Z01818 Encounter for other preprocedural examination: Secondary | ICD-10-CM

## 2024-02-10 DIAGNOSIS — D235 Other benign neoplasm of skin of trunk: Secondary | ICD-10-CM | POA: Insufficient documentation

## 2024-02-10 HISTORY — PX: BREAST REDUCTION SURGERY: SHX8

## 2024-02-10 HISTORY — DX: Hypertrophy of breast: N62

## 2024-02-10 HISTORY — DX: Anxiety disorder, unspecified: F41.9

## 2024-02-10 LAB — POCT PREGNANCY, URINE: Preg Test, Ur: NEGATIVE

## 2024-02-10 SURGERY — MAMMOPLASTY, REDUCTION
Anesthesia: General | Site: Breast | Laterality: Bilateral

## 2024-02-10 MED ORDER — GABAPENTIN 300 MG PO CAPS
ORAL_CAPSULE | ORAL | Status: AC
  Filled 2024-02-10: qty 1

## 2024-02-10 MED ORDER — PROPOFOL 10 MG/ML IV BOLUS
INTRAVENOUS | Status: DC | PRN
  Administered 2024-02-10: 200 mg via INTRAVENOUS

## 2024-02-10 MED ORDER — OXYCODONE HCL 5 MG/5ML PO SOLN: 5.0000 mg | Freq: Once | ORAL | Status: DC | PRN

## 2024-02-10 MED ORDER — ACETAMINOPHEN 500 MG PO TABS
1000.0000 mg | ORAL_TABLET | Freq: Once | ORAL | Status: AC
  Administered 2024-02-10: 1000 mg via ORAL

## 2024-02-10 MED ORDER — EPHEDRINE SULFATE-NACL 50-0.9 MG/10ML-% IV SOSY
PREFILLED_SYRINGE | INTRAVENOUS | Status: DC | PRN
  Administered 2024-02-10: 10 mg via INTRAVENOUS
  Administered 2024-02-10: 5 mg via INTRAVENOUS

## 2024-02-10 MED ORDER — ROCURONIUM 10MG/ML (10ML) SYRINGE FOR MEDFUSION PUMP - OPTIME: INTRAVENOUS | Status: DC | PRN

## 2024-02-10 MED ORDER — CHLORHEXIDINE GLUCONATE CLOTH 2 % EX PADS: 6.0000 | MEDICATED_PAD | Freq: Once | CUTANEOUS | Status: DC

## 2024-02-10 MED ORDER — GABAPENTIN 300 MG PO CAPS
300.0000 mg | ORAL_CAPSULE | Freq: Once | ORAL | Status: AC
  Administered 2024-02-10: 300 mg via ORAL

## 2024-02-10 MED ORDER — 0.9 % SODIUM CHLORIDE (POUR BTL) OPTIME
TOPICAL | Status: DC | PRN
  Administered 2024-02-10 (×2): 1000 mL

## 2024-02-10 MED ORDER — BUPIVACAINE HCL (PF) 0.25 % IJ SOLN
INTRAMUSCULAR | Status: AC
  Filled 2024-02-10: qty 30

## 2024-02-10 MED ORDER — ACETAMINOPHEN 500 MG PO TABS
ORAL_TABLET | ORAL | Status: AC
  Filled 2024-02-10: qty 2

## 2024-02-10 MED ORDER — ROCURONIUM BROMIDE 10 MG/ML (PF) SYRINGE
PREFILLED_SYRINGE | INTRAVENOUS | Status: AC
  Filled 2024-02-10: qty 10

## 2024-02-10 MED ORDER — HYDROMORPHONE HCL 1 MG/ML IJ SOLN
0.2500 mg | INTRAMUSCULAR | Status: DC | PRN
  Administered 2024-02-10 (×2): 0.5 mg via INTRAVENOUS

## 2024-02-10 MED ORDER — SUGAMMADEX SODIUM 200 MG/2ML IV SOLN
INTRAVENOUS | Status: DC | PRN
  Administered 2024-02-10: 200 mg via INTRAVENOUS

## 2024-02-10 MED ORDER — DROPERIDOL 2.5 MG/ML IJ SOLN: 0.6250 mg | Freq: Once | INTRAMUSCULAR | Status: DC | PRN

## 2024-02-10 MED ORDER — FENTANYL CITRATE (PF) 100 MCG/2ML IJ SOLN
INTRAMUSCULAR | Status: AC
  Filled 2024-02-10: qty 2

## 2024-02-10 MED ORDER — CEFAZOLIN SODIUM-DEXTROSE 2-4 GM/100ML-% IV SOLN
2.0000 g | INTRAVENOUS | Status: AC
  Administered 2024-02-10: 2 g via INTRAVENOUS

## 2024-02-10 MED ORDER — HYDROMORPHONE HCL 1 MG/ML IJ SOLN
INTRAMUSCULAR | Status: AC
  Filled 2024-02-10: qty 0.5

## 2024-02-10 MED ORDER — CEFAZOLIN SODIUM-DEXTROSE 2-4 GM/100ML-% IV SOLN
INTRAVENOUS | Status: AC
Start: 2024-02-10 — End: ?
  Filled 2024-02-10: qty 100

## 2024-02-10 MED ORDER — GLYCOPYRROLATE PF 0.2 MG/ML IJ SOSY
PREFILLED_SYRINGE | INTRAMUSCULAR | Status: AC
  Filled 2024-02-10: qty 1

## 2024-02-10 MED ORDER — FENTANYL CITRATE (PF) 100 MCG/2ML IJ SOLN
INTRAMUSCULAR | Status: DC | PRN
  Administered 2024-02-10 (×4): 50 ug via INTRAVENOUS

## 2024-02-10 MED ORDER — PHENYLEPHRINE HCL (PRESSORS) 10 MG/ML IV SOLN
INTRAVENOUS | Status: DC | PRN
  Administered 2024-02-10 (×8): 160 ug via INTRAVENOUS

## 2024-02-10 MED ORDER — DEXMEDETOMIDINE HCL IN NACL 80 MCG/20ML IV SOLN
INTRAVENOUS | Status: DC | PRN
  Administered 2024-02-10: 8 ug via INTRAVENOUS
  Administered 2024-02-10: 12 ug via INTRAVENOUS

## 2024-02-10 MED ORDER — EPHEDRINE 5 MG/ML INJ
INTRAVENOUS | Status: AC
  Filled 2024-02-10: qty 5

## 2024-02-10 MED ORDER — ONDANSETRON HCL 4 MG/2ML IJ SOLN
INTRAMUSCULAR | Status: AC
  Filled 2024-02-10: qty 2

## 2024-02-10 MED ORDER — BUPIVACAINE LIPOSOME 1.3 % IJ SUSP
INTRAMUSCULAR | Status: AC
  Filled 2024-02-10: qty 20

## 2024-02-10 MED ORDER — MIDAZOLAM HCL 5 MG/5ML IJ SOLN
INTRAMUSCULAR | Status: DC | PRN
  Administered 2024-02-10: 2 mg via INTRAVENOUS

## 2024-02-10 MED ORDER — LIDOCAINE 2% (20 MG/ML) 5 ML SYRINGE
INTRAMUSCULAR | Status: AC
  Filled 2024-02-10: qty 5

## 2024-02-10 MED ORDER — BUPIVACAINE LIPOSOME 1.3 % IJ SUSP
INTRAMUSCULAR | Status: DC | PRN
  Administered 2024-02-10 (×2): 10 mL

## 2024-02-10 MED ORDER — SODIUM CHLORIDE (PF) 0.9 % IJ SOLN
INTRAMUSCULAR | Status: AC
  Filled 2024-02-10: qty 50

## 2024-02-10 MED ORDER — PHENYLEPHRINE 80 MCG/ML (10ML) SYRINGE FOR IV PUSH (FOR BLOOD PRESSURE SUPPORT)
PREFILLED_SYRINGE | INTRAVENOUS | Status: AC
  Filled 2024-02-10: qty 10

## 2024-02-10 MED ORDER — BUPIVACAINE HCL (PF) 0.5 % IJ SOLN
INTRAMUSCULAR | Status: AC
  Filled 2024-02-10: qty 30

## 2024-02-10 MED ORDER — MIDAZOLAM HCL 2 MG/2ML IJ SOLN
INTRAMUSCULAR | Status: AC
  Filled 2024-02-10: qty 2

## 2024-02-10 MED ORDER — PHENYLEPHRINE HCL-NACL 20-0.9 MG/250ML-% IV SOLN: INTRAVENOUS | Status: DC | PRN

## 2024-02-10 MED ORDER — OXYCODONE HCL 5 MG PO TABS: 5.0000 mg | ORAL_TABLET | Freq: Once | ORAL | Status: DC | PRN

## 2024-02-10 MED ORDER — SCOPOLAMINE 1 MG/3DAYS TD PT72
MEDICATED_PATCH | TRANSDERMAL | Status: DC | PRN
  Administered 2024-02-10: 1 via TRANSDERMAL

## 2024-02-10 MED ORDER — LACTATED RINGERS IV SOLN: INTRAVENOUS | Status: DC

## 2024-02-10 MED ORDER — BUPIVACAINE HCL (PF) 0.25 % IJ SOLN
INTRAMUSCULAR | Status: DC | PRN
  Administered 2024-02-10 (×2): 15 mL

## 2024-02-10 MED ORDER — SODIUM CHLORIDE (PF) 0.9 % IJ SOLN
INTRAMUSCULAR | Status: DC | PRN
  Administered 2024-02-10 (×2): 25 mL

## 2024-02-10 MED ORDER — LIDOCAINE 2% (20 MG/ML) 5 ML SYRINGE
INTRAMUSCULAR | Status: DC | PRN
  Administered 2024-02-10: 80 mg via INTRAVENOUS

## 2024-02-10 MED ORDER — ROCURONIUM 10MG/ML (10ML) SYRINGE FOR MEDFUSION PUMP - OPTIME
INTRAVENOUS | Status: DC | PRN
  Administered 2024-02-10: 80 mg via INTRAVENOUS

## 2024-02-10 MED ORDER — HYDROMORPHONE HCL 1 MG/ML IJ SOLN
INTRAMUSCULAR | Status: DC | PRN
  Administered 2024-02-10: .5 mg via INTRAVENOUS

## 2024-02-10 MED ORDER — SCOPOLAMINE 1 MG/3DAYS TD PT72
MEDICATED_PATCH | TRANSDERMAL | Status: AC
  Filled 2024-02-10: qty 1

## 2024-02-10 MED ORDER — DEXAMETHASONE SODIUM PHOSPHATE 10 MG/ML IJ SOLN
INTRAMUSCULAR | Status: DC | PRN
  Administered 2024-02-10: 10 mg via INTRAVENOUS

## 2024-02-10 MED ORDER — ONDANSETRON HCL 4 MG/2ML IJ SOLN
INTRAMUSCULAR | Status: DC | PRN
  Administered 2024-02-10 (×2): 4 mg via INTRAVENOUS

## 2024-02-10 MED ORDER — DEXAMETHASONE SODIUM PHOSPHATE 10 MG/ML IJ SOLN
INTRAMUSCULAR | Status: AC
  Filled 2024-02-10: qty 1

## 2024-02-10 SURGICAL SUPPLY — 55 items
BINDER BREAST 3XL (GAUZE/BANDAGES/DRESSINGS) IMPLANT
BINDER BREAST LRG (GAUZE/BANDAGES/DRESSINGS) IMPLANT
BINDER BREAST MEDIUM (GAUZE/BANDAGES/DRESSINGS) IMPLANT
BINDER BREAST XLRG (GAUZE/BANDAGES/DRESSINGS) IMPLANT
BINDER BREAST XXLRG (GAUZE/BANDAGES/DRESSINGS) IMPLANT
BIOPATCH RED 1 DISK 7.0 (GAUZE/BANDAGES/DRESSINGS) ×2 IMPLANT
BLADE SURG 10 STRL SS (BLADE) ×6 IMPLANT
BLADE SURG 15 STRL LF DISP TIS (BLADE) ×1 IMPLANT
CANISTER SUCT 1200ML W/VALVE (MISCELLANEOUS) ×1 IMPLANT
DERMABOND ADVANCED .7 DNX12 (GAUZE/BANDAGES/DRESSINGS) ×2 IMPLANT
DRAIN CHANNEL 19F RND (DRAIN) ×2 IMPLANT
DRAPE IMP U-DRAPE 54X76 (DRAPES) IMPLANT
DRAPE UTILITY XL STRL (DRAPES) ×1 IMPLANT
DRSG TEGADERM 4X4.75 (GAUZE/BANDAGES/DRESSINGS) ×2 IMPLANT
ELECT BLADE 4.0 EZ CLEAN MEGAD (MISCELLANEOUS) ×1 IMPLANT
ELECT REM PT RETURN 9FT ADLT (ELECTROSURGICAL) ×2 IMPLANT
ELECTRODE BLDE 4.0 EZ CLN MEGD (MISCELLANEOUS) ×1 IMPLANT
ELECTRODE REM PT RTRN 9FT ADLT (ELECTROSURGICAL) ×2 IMPLANT
EVACUATOR SILICONE 100CC (DRAIN) ×2 IMPLANT
GAUZE PAD ABD 8X10 STRL (GAUZE/BANDAGES/DRESSINGS) ×4 IMPLANT
GAUZE SPONGE 2X2 STRL 8-PLY (GAUZE/BANDAGES/DRESSINGS) ×2 IMPLANT
GLOVE BIO SURGEON STRL SZ 6.5 (GLOVE) IMPLANT
GLOVE BIO SURGEON STRL SZ7.5 (GLOVE) IMPLANT
GLOVE BIO SURGEON STRL SZ8 (GLOVE) ×1 IMPLANT
GLOVE BIOGEL PI IND STRL 7.0 (GLOVE) IMPLANT
GLOVE BIOGEL PI IND STRL 8 (GLOVE) ×1 IMPLANT
GOWN STRL REUS W/ TWL LRG LVL3 (GOWN DISPOSABLE) ×1 IMPLANT
GOWN STRL REUS W/TWL XL LVL3 (GOWN DISPOSABLE) ×1 IMPLANT
HEMOSTAT ARISTA ABSORB 3G PWDR (HEMOSTASIS) IMPLANT
HIBICLENS CHG 4% 4OZ BTL (MISCELLANEOUS) ×1 IMPLANT
MARKER SKIN DUAL TIP RULER LAB (MISCELLANEOUS) ×1 IMPLANT
NDL HYPO 22X1.5 SAFETY MO (MISCELLANEOUS) ×2 IMPLANT
NEEDLE HYPO 22X1.5 SAFETY MO (MISCELLANEOUS) ×2 IMPLANT
NS IRRIG 1000ML POUR BTL (IV SOLUTION) ×1 IMPLANT
PACK BASIN DAY SURGERY FS (CUSTOM PROCEDURE TRAY) ×1 IMPLANT
PACK UNIVERSAL I (CUSTOM PROCEDURE TRAY) ×1 IMPLANT
PENCIL SMOKE EVACUATOR (MISCELLANEOUS) ×2 IMPLANT
PIN SAFETY STERILE (MISCELLANEOUS) ×1 IMPLANT
SLEEVE SCD COMPRESS KNEE MED (STOCKING) ×1 IMPLANT
SPONGE T-LAP 18X18 ~~LOC~~+RFID (SPONGE) ×3 IMPLANT
STAPLER SKIN PROX WIDE 3.9 (STAPLE) ×1 IMPLANT
SUT MNCRL AB 3-0 PS2 27 (SUTURE) ×4 IMPLANT
SUT MNCRL AB 4-0 PS2 18 (SUTURE) ×4 IMPLANT
SUT MON AB 2-0 CT1 36 (SUTURE) ×1 IMPLANT
SUT MON AB 5-0 PS2 18 (SUTURE) IMPLANT
SUT SILK 2 0 SH (SUTURE) ×2 IMPLANT
SUT VIC AB 3-0 SH 27X BRD (SUTURE) IMPLANT
SYR 20ML LL LF (SYRINGE) ×2 IMPLANT
SYR BULB IRRIG 60ML STRL (SYRINGE) ×1 IMPLANT
SYR CONTROL 10ML LL (SYRINGE) ×1 IMPLANT
TOWEL GREEN STERILE FF (TOWEL DISPOSABLE) ×2 IMPLANT
TRAY DSU PREP LF (CUSTOM PROCEDURE TRAY) ×1 IMPLANT
TUBE CONNECTING 20X1/4 (TUBING) ×1 IMPLANT
UNDERPAD 30X36 HEAVY ABSORB (UNDERPADS AND DIAPERS) ×2 IMPLANT
YANKAUER SUCT BULB TIP NO VENT (SUCTIONS) ×1 IMPLANT

## 2024-02-10 NOTE — Discharge Instructions (Addendum)
 Activity: Avoid strenuous activity.  No lifting, pushing, or pulling greater than 15 pounds.  Diet: No restrictions.  Try to optimize nutrition with plenty of proteins, fruits, and vegetables to improve healing.   Wound Care: Leave breast binder on for the first week and then you may transition to a front-clipping or front-zipping compression bra.  Sponge bathe only until our visit tomorrow, then we can discuss transition to showering with the emphasis on keeping the surgical site dry.  Replace the ABD pads over incisions with gauze or Maxi pads as needed for incisional drainage.   If you have drains placed, be sure to record the daily output from each side. They will be removed at your appointment tomorrow. Please make sure that the bulbs are charged.  If you experience any issues, please call the office.    Follow-Up: Scheduled for tomorrow.  Things to watch for:  Call the office if you experience fever, chills, intractable vomiting, or significant bleeding.  Mild wound drainage is common after breast reduction surgery and should not be cause for alarm.  No Tylenol before 4:15pm today.   Post Anesthesia Home Care Instructions  Activity: Get plenty of rest for the remainder of the day. A responsible individual must stay with you for 24 hours following the procedure.  For the next 24 hours, DO NOT: -Drive a car -Advertising copywriter -Drink alcoholic beverages -Take any medication unless instructed by your physician -Make any legal decisions or sign important papers.  Meals: Start with liquid foods such as gelatin or soup. Progress to regular foods as tolerated. Avoid greasy, spicy, heavy foods. If nausea and/or vomiting occur, drink only clear liquids until the nausea and/or vomiting subsides. Call your physician if vomiting continues.  Special Instructions/Symptoms: Your throat may feel dry or sore from the anesthesia or the breathing tube placed in your throat during surgery. If this  causes discomfort, gargle with warm salt water. The discomfort should disappear within 24 hours.  If you had a scopolamine patch placed behind your ear for the management of post- operative nausea and/or vomiting:  1. The medication in the patch is effective for 72 hours, after which it should be removed.  Wrap patch in a tissue and discard in the trash. Wash hands thoroughly with soap and water. 2. You may remove the patch earlier than 72 hours if you experience unpleasant side effects which may include dry mouth, dizziness or visual disturbances. 3. Avoid touching the patch. Wash your hands with soap and water after contact with the patch.   Information for Discharge Teaching: EXPAREL (bupivacaine liposome injectable suspension)   Pain relief is important to your recovery. The goal is to control your pain so you can move easier and return to your normal activities as soon as possible after your procedure. Your physician may use several types of medicines to manage pain, swelling, and more.  Your surgeon or anesthesiologist gave you EXPAREL(bupivacaine) to help control your pain after surgery.  EXPAREL is a local anesthetic designed to release slowly over an extended period of time to provide pain relief by numbing the tissue around the surgical site. EXPAREL is designed to release pain medication over time and can control pain for up to 72 hours. Depending on how you respond to EXPAREL, you may require less pain medication during your recovery. EXPAREL can help reduce or eliminate the need for opioids during the first few days after surgery when pain relief is needed the most. EXPAREL is not an opioid  and is not addictive. It does not cause sleepiness or sedation.   Important! A teal colored band has been placed on your arm with the date, time and amount of EXPAREL you have received. Please leave this armband in place for the full 96 hours following administration, and then you may remove the  band. If you return to the hospital for any reason within 96 hours following the administration of EXPAREL, the armband provides important information that your health care providers to know, and alerts them that you have received this anesthetic.    Possible side effects of EXPAREL: Temporary loss of sensation or ability to move in the area where medication was injected. Nausea, vomiting, constipation Rarely, numbness and tingling in your mouth or lips, lightheadedness, or anxiety may occur. Call your doctor right away if you think you may be experiencing any of these sensations, or if you have other questions regarding possible side effects.  Follow all other discharge instructions given to you by your surgeon or nurse. Eat a healthy diet and drink plenty of water or other fluids.

## 2024-02-10 NOTE — Transfer of Care (Signed)
 Immediate Anesthesia Transfer of Care Note  Patient: [name]  Procedure(s) Performed: BILATERAL BREAST REDUCTION (Bilateral: Breast)  Patient Location: PACU  Anesthesia Type:General  Level of Consciousness: drowsy and patient cooperative  Airway & Oxygen Therapy: Patient Spontanous Breathing and Patient connected to face mask oxygen  Post-op Assessment: Report given to RN, Post -op Vital signs reviewed and stable, and Patient moving all extremities X 4  Post vital signs: Reviewed and stable  Last Vitals:  Vitals Value Taken Time  BP 125/86 02/10/24 1536  Temp 36.6 C 02/10/24 1536  Pulse 117 02/10/24 1544  Resp 18 02/10/24 1544  SpO2 100 % 02/10/24 1544  Vitals shown include unfiled device data.  Last Pain:  Vitals:   02/10/24 1536  TempSrc:   PainSc: Asleep         Complications: No notable events documented.

## 2024-02-10 NOTE — Anesthesia Procedure Notes (Signed)
 Procedure Name: Intubation Date/Time: 02/10/2024 11:42 AM  Performed by: Yolanda Bonine, CRNAPre-anesthesia Checklist: Patient identified, Emergency Drugs available, Suction available and Patient being monitored Patient Re-evaluated:Patient Re-evaluated prior to induction Oxygen Delivery Method: Circle system utilized Preoxygenation: Pre-oxygenation with 100% oxygen Induction Type: IV induction Ventilation: Mask ventilation without difficulty Grade View: Grade I Tube type: Oral Tube size: 7.0 mm Number of attempts: 1 Airway Equipment and Method: Stylet Placement Confirmation: ETT inserted through vocal cords under direct vision, positive ETCO2 and breath sounds checked- equal and bilateral Secured at: 21 cm Tube secured with: Tape Dental Injury: Teeth and Oropharynx as per pre-operative assessment

## 2024-02-10 NOTE — Op Note (Signed)
 DATE OF OPERATION: 02/10/2024  LOCATION: [facility] operating Room  PREOPERATIVE DIAGNOSIS: Symptomatic macromastia  POSTOPERATIVE DIAGNOSIS: Same  PROCEDURE: Bilateral breast reduction, excision of a skin lesion  SURGEON: Loren Racer, MD  ASSISTANT: Evelena Leyden  EBL: 75 cc  CONDITION: Stable  COMPLICATIONS: None  INDICATION: The patient, [name], is a 43 y.o. female born on 11-22-1981, is here for treatment of upper back and neck pain secondary to large breast size.  Recent change in skin lesion.  PROCEDURE DETAILS:  The patient was seen prior to surgery and marked.  IV antibiotics were given. The patient was taken to the operating room and given a general anesthetic. A standard time out was performed and all information was confirmed by those in the room. SCDs were placed.   The chest was prepped and draped in usual sterile manner.  A 42 mm cookie cutter was used to outline the proposed nipple areolar complexes and an 8 cm based inferior pedicle was outlined on each breast.  The right breast was addressed first.  A laparotomy tape was placed at the base of the breast as a tourniquet and the pedicle was de-epithelialized sharply.  The electrocautery was used to dissect the borders of the pedicle to the chest wall.  The medial lateral and superior triangles of breast tissue were resected with the electrocautery.  The superior skin flap was developed and thinned with the electrocautery.  The tissue removed constituted the bulk of the reduction and weighed 1746 g.  The surgical site was inspected for bleeding and hemostasis achieved with electrocautery.  The wound was irrigated with warm normal saline.  The subfascial space over the pectoralis muscle and the subcutaneous tissues were infiltrated with 50 mL of a mixture of quarter percent plain Marcaine, Exparel, and saline.  A 19 French round drain was placed behind the pedicle and brought out through a separate stab incision.  The  T point was approximated with a single 2-0 Monocryl suture and the skin edges tailor tacked in place with skin clips.  The dermis was closed with interrupted and running 3-0 Monocryl sutures and the skin was closed with a running 4-0 Monocryl subcuticular stitch.  The skin lesion which measured approximately 2 mm was excised sharply and sent to pathology for routine examination.  The excision site measured approximately 3 mm and was closed with a simple interrupted Monocryl suture.  Attention was turned to the left breast where similar procedure was performed.  After placing a laparotomy tape at the base of the breast the pedicle was de-epithelialized sharply and the borders dissected down to the chest wall.  The medial lateral and superior triangles of breast tissue were resected and the superior skin flap developed and thinned.  The breast tissue removed constituted the bulk of the reduction and weighed 1786 g.  All breast tissue was sent to pathology for routine examination.  The surgical wound was inspected for bleeding hemostasis achieved with electrocautery.  The surgical site was irrigated with warm normal saline.  The subfascial space and subcutaneous tissues were infiltrated with the Exparel mixture.  A 19 French round drain was placed behind the pedicle and brought out through a separate stab incision.  The T point was approximated with a single 2-0 Monocryl suture and the skin edges tailor tacked in place with skin clips.  The dermis was approximated with interrupted and running 3-0 Monocryl sutures and the skin was closed with a running 4-0 Monocryl subcuticular stitch.  The patient  was placed in a supportive compressive garment and awakened from anesthesia without incident.  She was transferred to the recovery room in good condition.  All instrument needle and sponge counts were reported as correct and no complications were appreciated during the procedure. The patient was allowed to wake up and taken  to recovery room in stable condition at the end of the case. The family was notified at the end of the case.   The advanced practice practitioner (APP) assisted throughout the case.  The APP was essential in retraction and counter traction when needed to make the case progress smoothly.  This retraction and assistance made it possible to see the tissue plans for the procedure.  The assistance was needed for blood control, tissue re-approximation and assisted with closure of the incision site.

## 2024-02-10 NOTE — Anesthesia Postprocedure Evaluation (Signed)
 Anesthesia Post Note  Patient: [name]  Procedure(s) Performed: BILATERAL BREAST REDUCTION (Bilateral: Breast)     Patient location during evaluation: PACU Anesthesia Type: General Level of consciousness: awake and alert Pain management: pain level controlled Vital Signs Assessment: post-procedure vital signs reviewed and stable Respiratory status: spontaneous breathing, nonlabored ventilation and respiratory function stable Cardiovascular status: blood pressure returned to baseline and stable Postop Assessment: no apparent nausea or vomiting Anesthetic complications: no   No notable events documented.  Last Vitals:  Vitals:   02/10/24 1615 02/10/24 1629  BP: 125/75 122/78  Pulse: (!) 103 98  Resp: 17 16  Temp:  36.7 C  SpO2: 95% 99%    Last Pain:  Vitals:   02/10/24 1710  TempSrc:   PainSc: 2                  Lowella Curb

## 2024-02-10 NOTE — Interval H&P Note (Signed)
 History and Physical Interval Note: No change in exam or indication for surgery Marked for a bilateral breast reduction and removal of mole with her assistance All questions answered. Will proceed at her request  02/10/2024 11:28 AM  [name]  has presented today for surgery, with the diagnosis of Macromastia.  The various methods of treatment have been discussed with the patient and family. After consideration of risks, benefits and other options for treatment, the patient has consented to  Procedure(s): BILATERAL BREAST REDUCTION (Bilateral) as a surgical intervention.  The patient's history has been reviewed, patient examined, no change in status, stable for surgery.  I have reviewed the patient's chart and labs.  Questions were answered to the patient's satisfaction.     Santiago Glad

## 2024-02-11 ENCOUNTER — Encounter: Payer: Medicaid Other | Admitting: Student

## 2024-02-11 ENCOUNTER — Encounter (HOSPITAL_BASED_OUTPATIENT_CLINIC_OR_DEPARTMENT_OTHER): Payer: Self-pay | Admitting: Plastic Surgery

## 2024-02-11 ENCOUNTER — Ambulatory Visit (INDEPENDENT_AMBULATORY_CARE_PROVIDER_SITE_OTHER): Payer: Medicaid Other | Admitting: Physician Assistant

## 2024-02-11 VITALS — BP 122/85 | HR 75

## 2024-02-11 DIAGNOSIS — Z9889 Other specified postprocedural states: Secondary | ICD-10-CM

## 2024-02-11 NOTE — Progress Notes (Signed)
 Patient is a pleasant 43 year old female s/p bilateral breast reduction performed 02/10/2024 by Dr. Ladona Ridgel who presents to clinic for postoperative follow-up and drain removal.  Reviewed operative report and over 1700 g was removed from each breast.  Today, patient is doing well.  Drain output has been minimal, approximately 30 to 35 cc each side in total since surgery.  She reports that there is some tightness, but has only had to take a single oxycodone.  Otherwise, pain is well-controlled.  Denies any leg swelling, chest pain, difficulty breathing, or fevers.  She is tolerating p.o. intake without difficulty.  Ambulatory.  Voiding, no BM yet.  On exam, breasts have excellent shape and symmetry.  No seroma or hematoma.  NAC's are healthy and viable, sensitive to light touch.  Incisions CDI throughout.  Drains intact and functional with normal serous output.  She is doing excellent from postoperative standpoint.  Drains removed without complication or difficulty.  Suspect that she is going to have an excellent result.  Follow-up as scheduled.  She understands that she can call the office should she have any questions or concerns in interim.

## 2024-02-12 LAB — SURGICAL PATHOLOGY

## 2024-02-16 ENCOUNTER — Ambulatory Visit (INDEPENDENT_AMBULATORY_CARE_PROVIDER_SITE_OTHER): Payer: Medicaid Other | Admitting: Plastic Surgery

## 2024-02-16 ENCOUNTER — Encounter: Payer: Self-pay | Admitting: Plastic Surgery

## 2024-02-16 VITALS — BP 146/85 | HR 77

## 2024-02-16 DIAGNOSIS — Z9889 Other specified postprocedural states: Secondary | ICD-10-CM

## 2024-02-16 NOTE — Progress Notes (Signed)
 Ms. Szymanowski returns today approximately 6 days postop from bilateral breast reduction where we removed approximately 1700 g per side.  She is doing well but she noted 2 small "blisters" on the inferior portion of the left breast.  She has had no increased pain or drainage from the breast.  On examination there is approximately a 1 x 1 cm area of epidermal lysis on the left breast.  The skin is currently intact however it is likely that this will slough and she will require dressing changes.  I discussed with her that this is not uncommon especially with a breast reduction as large as hers was.  She will likely require dressing changes which will be a small amount of Vaseline and a clean dressing daily over the wound once the skin sloughs.  She continue wearing support that does not allow for significant movement.  She will follow-up next week.

## 2024-02-17 ENCOUNTER — Telehealth: Payer: Self-pay | Admitting: Plastic Surgery

## 2024-02-17 NOTE — Telephone Encounter (Signed)
 I called the patient in regards to her call to the office.  She states that she noticed a little bit of whitish-yellow drainage on her incision, near where the epidermal lysis was.  She denies any increased pain, redness, fevers or chills.  She reports that it improved after she took a shower.  Discussed with her that I suspect that this is some exudate from where she will develop a small wound.  I did offer her an appointment to come in to be seen this week.  Patient declined at this time.  Recommended that she keep a close eye on her breast and follow-up in the clinic next week.  Patient expressed understanding.

## 2024-02-17 NOTE — Telephone Encounter (Signed)
 Patient states that there is puss coming through the incision , please reach out and advise.

## 2024-02-18 ENCOUNTER — Encounter: Payer: Medicaid Other | Admitting: Student

## 2024-02-20 ENCOUNTER — Encounter: Payer: Medicaid Other | Admitting: Surgical

## 2024-02-23 ENCOUNTER — Ambulatory Visit (INDEPENDENT_AMBULATORY_CARE_PROVIDER_SITE_OTHER): Admitting: Physician Assistant

## 2024-02-23 VITALS — BP 122/81 | HR 77

## 2024-02-23 DIAGNOSIS — Z9889 Other specified postprocedural states: Secondary | ICD-10-CM

## 2024-02-23 NOTE — Progress Notes (Signed)
 Patient is a pleasant 43 year old female s/p bilateral breast reduction performed 02/10/2024 by Dr. Ladona Ridgel who presents to clinic for postoperative follow-up.  Reviewed operative report and over 1700 g was removed from each breast.  She was last seen here in clinic on 02/16/2024.  At that time, there was a 1 x 1 cm of epidermal lysis on the left breast, suspected that she would develop a wound at that location.  Otherwise, she was doing well.  Plan for continued compressive binder.  Today, she is doing well.  Endorses minimal drainage from left inferior T zone wound.  Mildly yellow appearing.  She has been covering with a Band-Aid.  She does endorse some lateral breast fullness bilaterally.  Otherwise, she is doing quite well.  Denies any chest pain, difficulty breathing, leg swelling, fevers, or other concerns.  On exam, breasts have excellent shape and symmetry.  NAC's are healthy and viable.  Bandages removed from left inferior T zone.  No significant wound development.  The epidermal lysis has not really progressed since last encounter.  She understands that it still may separate a bit as it softens.  No palpable underlying fluid collections.  No overlying erythema or other skin changes.  Recommending Vaseline to the incisions throughout.  Continue with activity modifications and compressive garments.  Follow-up in 2 weeks as scheduled, but she can certainly call the office should she have any questions or concerns in interim.  Picture(s) obtained of the patient and placed in the chart were with the patient's or guardian's permission.  Will provide letter via MyChart requesting that she avoid any lifting, pushing, or pulling greater than 15 pounds upon return to work 03/10/2024.

## 2024-03-08 ENCOUNTER — Encounter: Payer: Medicaid Other | Admitting: Student

## 2024-03-08 ENCOUNTER — Ambulatory Visit (INDEPENDENT_AMBULATORY_CARE_PROVIDER_SITE_OTHER): Admitting: Physician Assistant

## 2024-03-08 VITALS — BP 130/85 | HR 79

## 2024-03-08 DIAGNOSIS — Z9889 Other specified postprocedural states: Secondary | ICD-10-CM

## 2024-03-08 NOTE — Progress Notes (Signed)
 Patient is a pleasant 43 year old female s/p bilateral breast reduction performed 02/10/2024 by Dr. Ladona Ridgel who presents to clinic for postoperative follow-up.  Reviewed operative report and over 1700 g was removed from each breast.   She was last seen here in clinic on 02/23/2024.  At that time, she was doing well.  Recommended continued Aquaphor to the incisions throughout.  Continue with activity modifications and compressive garments.  Today, patient is doing okay.  She states that she has been applying Vaseline to her incisions throughout.  She does notice some yellowish drainage from the inferior T zone wound left breast, but is otherwise doing well and without complaints.  She returns to work soon.  Suspects that she will be able to have somebody assist her with the required heavy lifting.  On exam, breasts have excellent shape and symmetry.  NAC's are healthy bilaterally.  She does have a small, 2 x 1 cm inferior T zone wound left breast.  Mild incisional slough noted within the wound.  Does not appear to be particularly deep.  No considerable drainage on exam.  Remainder of incisions are healing well throughout.  Residual overlying Dermabond noted.  Recommending continued Vaseline/Aquaphor to the incisions throughout as well as the wound.  The petroleum jelly will help with the residual Dermabond.  She can expect continued yellowish drainage until the slough melts away.  Follow-up in 2 weeks for likely final postoperative encounter.  She can certainly call the office should she have questions or concerns in interim.  Continue activity modifications and compressive garments.  Picture(s) obtained of the patient and placed in the chart were with the patient's or guardian's permission.

## 2024-03-09 ENCOUNTER — Encounter: Payer: Medicaid Other | Admitting: Student

## 2024-03-10 ENCOUNTER — Telehealth: Payer: Self-pay | Admitting: Plastic Surgery

## 2024-03-10 NOTE — Telephone Encounter (Signed)
 Ok thank you, please keep me posted.

## 2024-03-10 NOTE — Telephone Encounter (Signed)
 Pt called and said that she is still waiting on a fitness for duty to return to work today sent to her work.

## 2024-03-10 NOTE — Telephone Encounter (Signed)
 Does she need a specific form completed or is it OK if we just write her a letter? It looks like Matt completed the FMLA stating that she would be fine to return to work today, but to avoid any heavy lifting, pushing, or pulling until 03/22/2024.   I just saw her on Monday. She is cleared to return to work, but asked that she continue to delegate heavy pushing, pulling, or lifting to her colleagues given her small incisional wound.

## 2024-03-15 ENCOUNTER — Encounter: Payer: Medicaid Other | Admitting: Plastic Surgery

## 2024-03-22 ENCOUNTER — Ambulatory Visit: Admitting: Physician Assistant

## 2024-03-22 VITALS — BP 148/96 | HR 72

## 2024-03-22 DIAGNOSIS — Z9889 Other specified postprocedural states: Secondary | ICD-10-CM

## 2024-03-22 NOTE — Progress Notes (Signed)
 Patient is a pleasant 43 year old female s/p bilateral breast reduction performed 02/10/2024 by Dr. Ladona Ridgel who presents to clinic for postoperative follow-up. Reviewed operative report and over 1700 g was removed from each breast.   She was last seen here in clinic on 03/08/2024.  At that time, exam was entirely benign aside from a small, 2 x 1 cm inferior T zone wound on left breast.  Mild incisional slough noted within the wound, but appears superficial.  Recommending Vaseline or Aquaphor to the incision and wound.  Follow-up in 2 weeks.  Today, patient is doing well.  States that he continues to apply Aquaphor to her incisions throughout.  She is keeping the inferior T zone wound covered with a bandage.  On exam, breasts have excellent shape and symmetry.  There is a moderate amount of residual Dermabond throughout each breast.  This is gently removed with pickups.  Her inferior T zone wound has filled in, but still is in final stages of epithelialization.  She understands that it can take upwards of a year before her normal pigment returns to that area.  A couple scattered sutures are removed without complication.  Breasts are soft throughout with the exception of a small area of discrete firmness medial aspect left breast.  Recommending gentle mechanical massage of the small area of firmness.  As for the incisions, removed the majority of the Dermabond here today.  Recommending that she continue to help remove the Dermabond independently at home/skin then transition to silicone scar gel twice daily x 3 months.  Suspect that this small inferior T zone wound will not have any issues fully resolved.  No ongoing activity restrictions.  Follow-up only as needed.  Patient is overall pleased.  Picture(s) obtained of the patient and placed in the chart were with the patient's or guardian's permission.

## 2024-03-23 ENCOUNTER — Encounter: Payer: Medicaid Other | Admitting: Student
